# Patient Record
Sex: Male | Born: 1945 | Race: White | Hispanic: No | Marital: Married | Smoking: Former smoker
Health system: Southern US, Community
[De-identification: ages and names within clinical notes are randomized; demographics above are authoritative.]

## PROBLEM LIST (undated history)

## (undated) DIAGNOSIS — M1990 Unspecified osteoarthritis, unspecified site: Secondary | ICD-10-CM

## (undated) HISTORY — PX: HERNIA REPAIR: SHX51

## (undated) HISTORY — PX: TONSILLECTOMY: SUR1361

---

## 2011-08-10 IMAGING — CR DG HAND COMPLETE 3+V*R*
3 series · 3 of 3 positions shown · non-contrast
Comparison: None.

CLINICAL DATA: Hand injury and pain.

RIGHT HAND - COMPLETE 3+ VIEW

[PA]
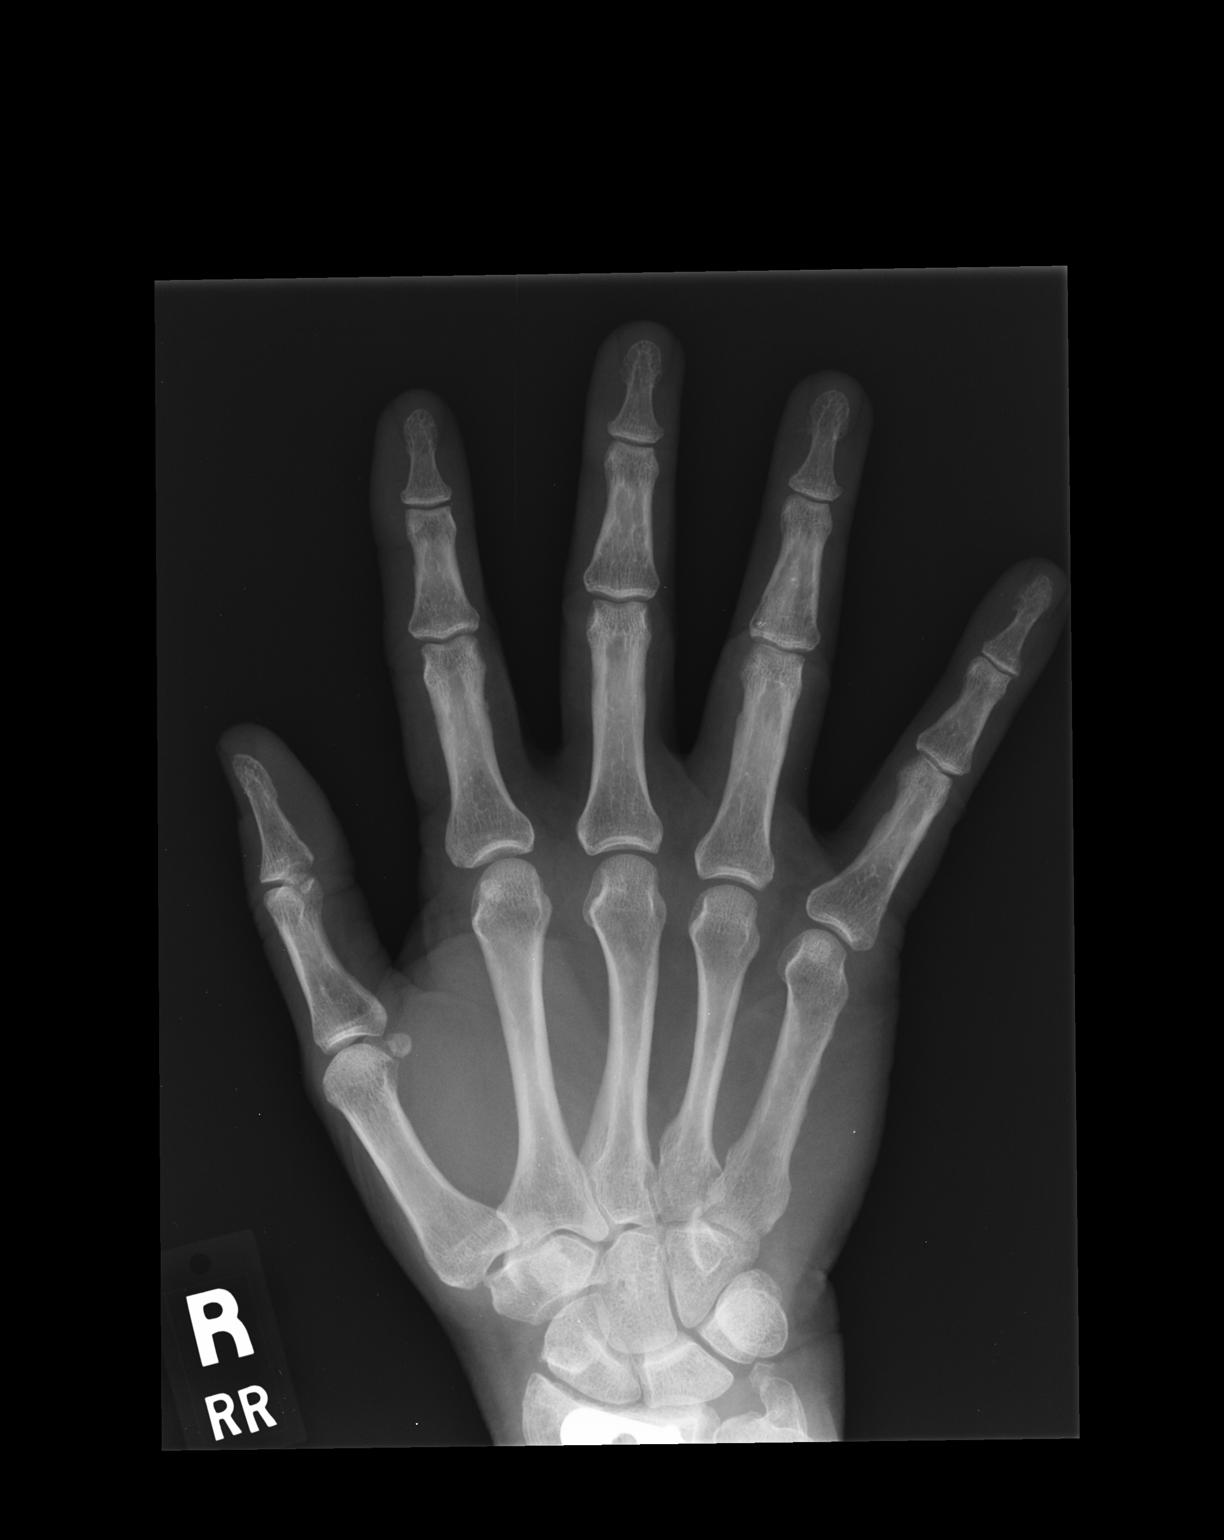

[lateral]
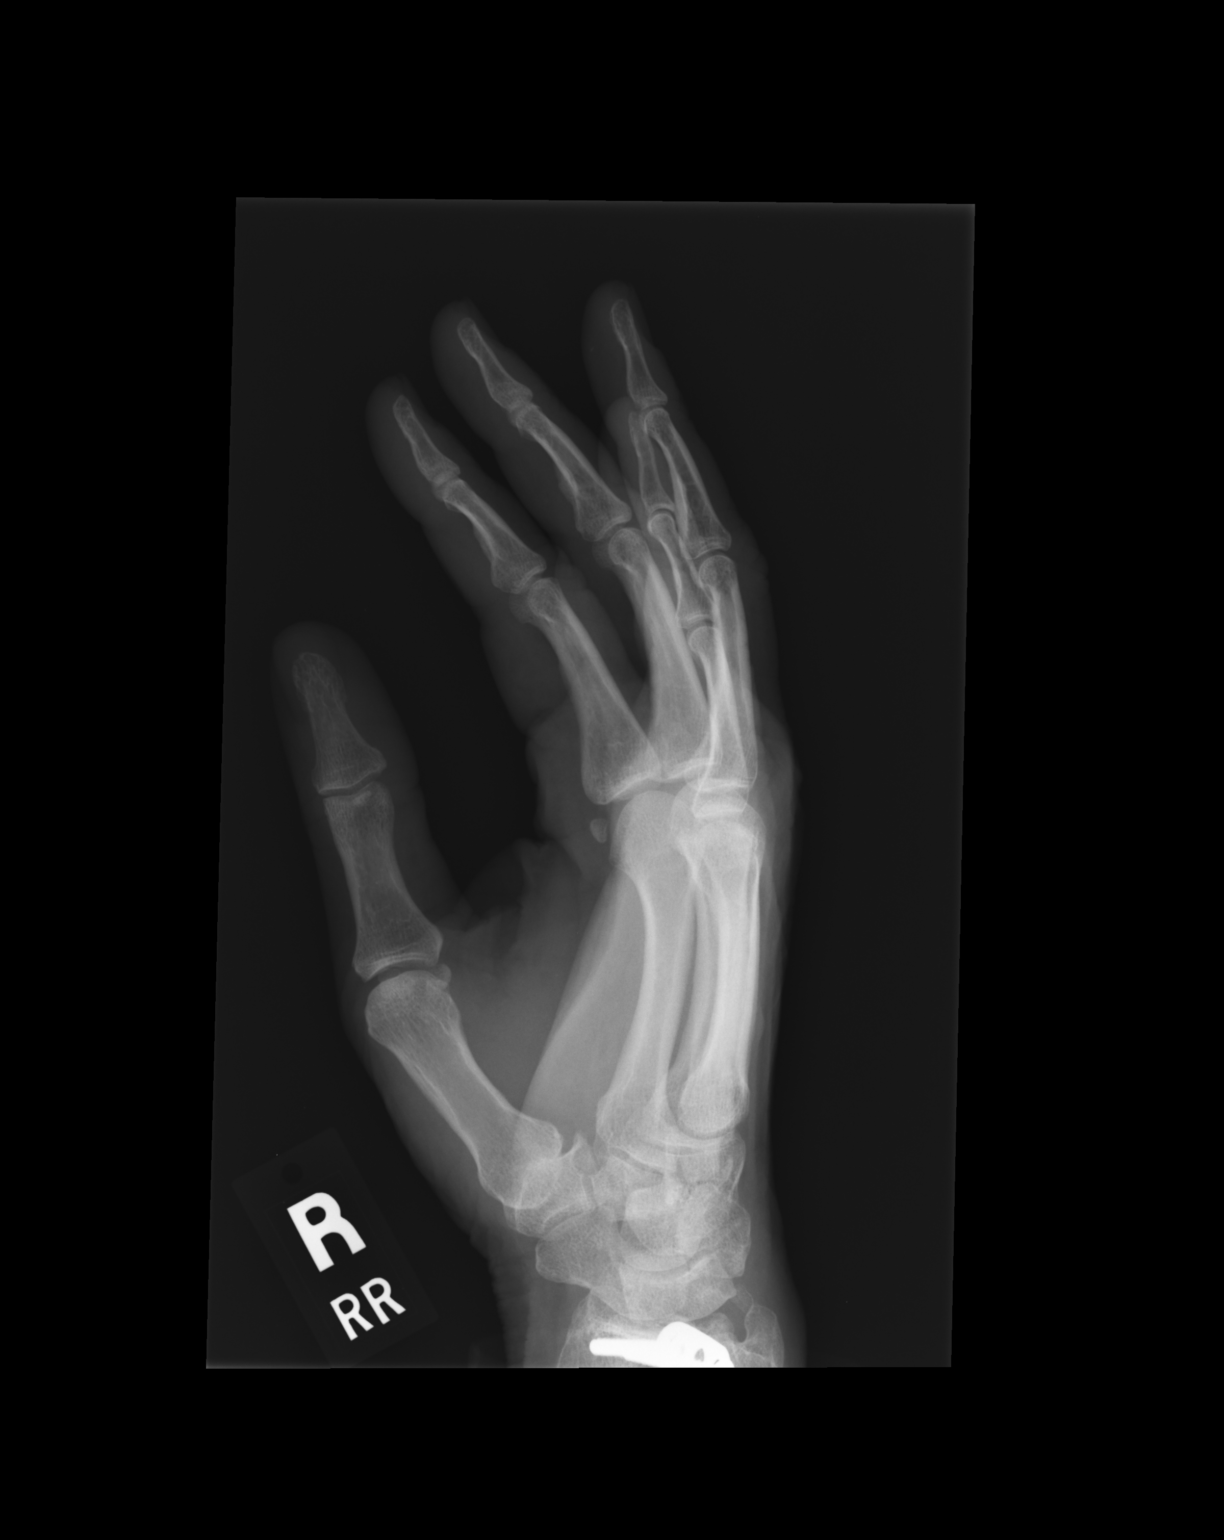

[pa obl]
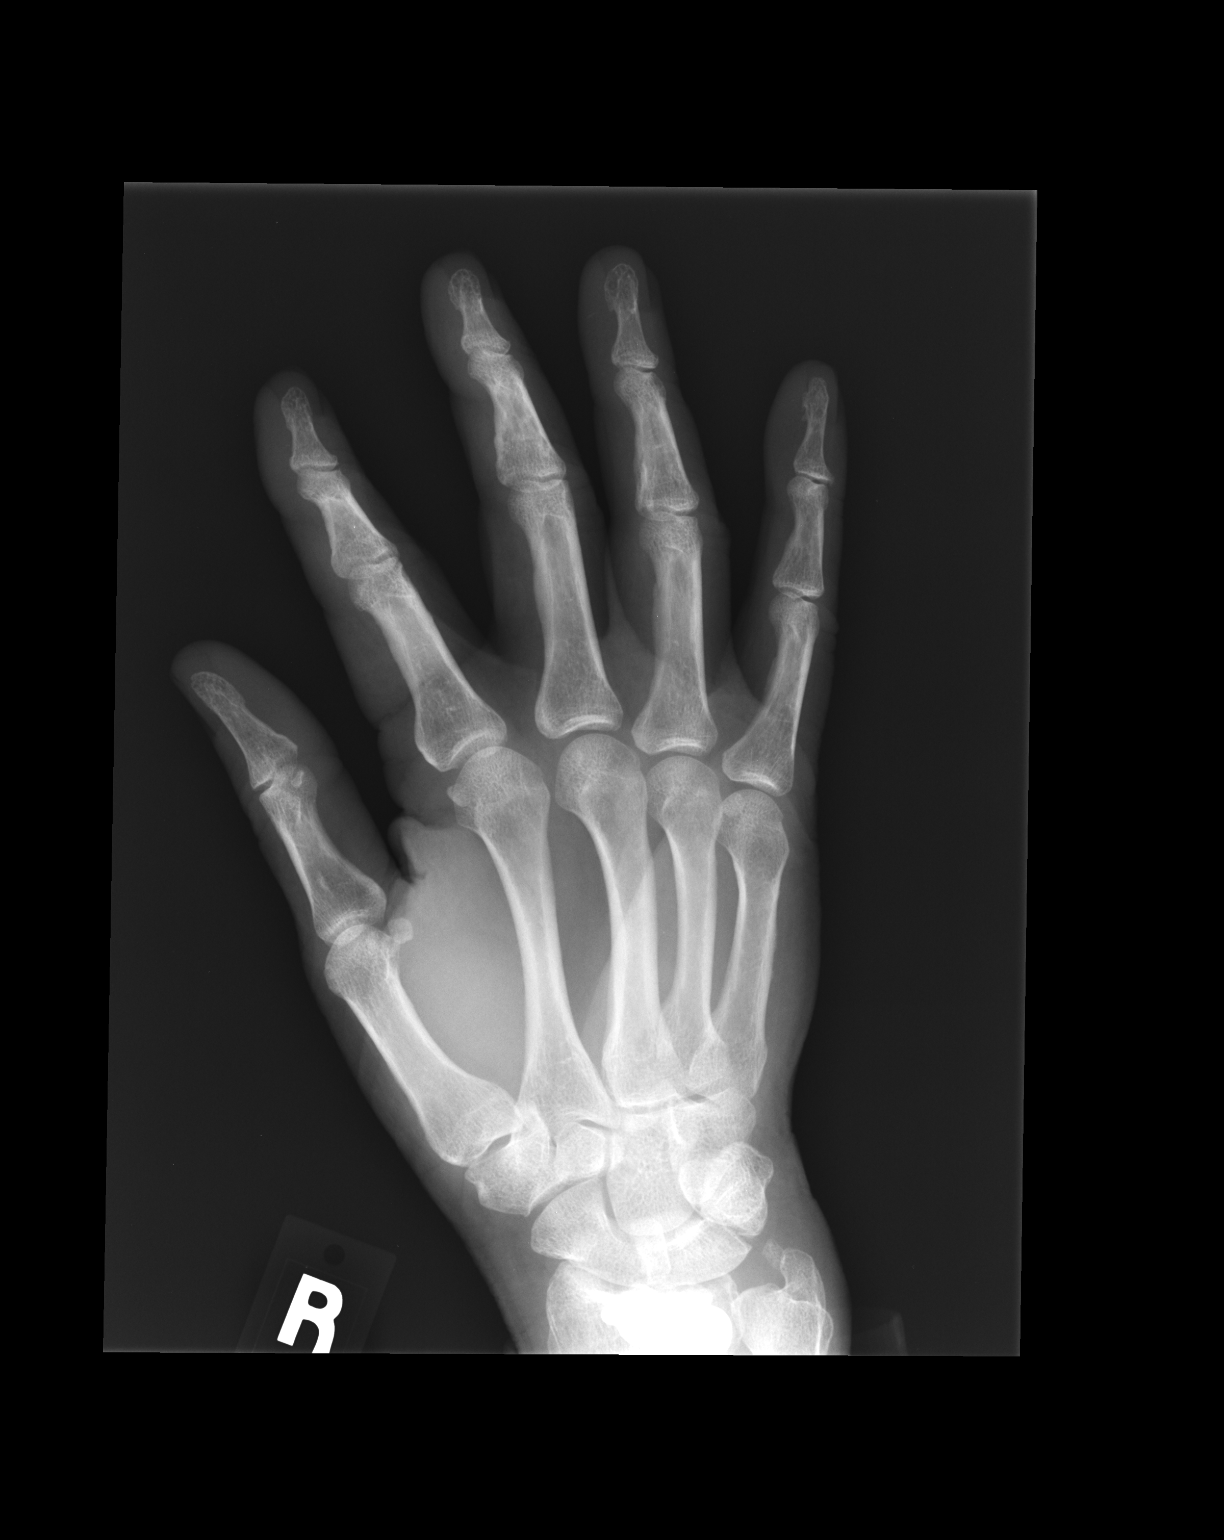

[3 of 3 positions shown; findings below may reference images not displayed]

FINDINGS: No evidence of acute fracture or dislocation.  No other
significant bone abnormality identified.  Distal portion of
fixation plate and screws noted in the distal radius.
IMPRESSION: No acute findings.

## 2013-09-06 ENCOUNTER — Encounter (INDEPENDENT_AMBULATORY_CARE_PROVIDER_SITE_OTHER): Payer: Self-pay

## 2013-09-06 ENCOUNTER — Ambulatory Visit
Admission: RE | Admit: 2013-09-06 | Discharge: 2013-09-06 | Disposition: A | Discharge status: Home / Self Care | Payer: Medicare HMO | Source: Ambulatory Visit | Attending: Nurse Practitioner | Admitting: Nurse Practitioner

## 2013-09-06 ENCOUNTER — Other Ambulatory Visit: Payer: Self-pay | Admitting: Nurse Practitioner

## 2013-09-06 DIAGNOSIS — M79662 Pain in left lower leg: Secondary | ICD-10-CM

## 2013-09-06 DIAGNOSIS — M79669 Pain in unspecified lower leg: Secondary | ICD-10-CM

## 2015-08-18 ENCOUNTER — Telehealth: Payer: Self-pay | Admitting: Genetic Counselor

## 2015-08-18 ENCOUNTER — Encounter: Payer: Self-pay | Admitting: Genetic Counselor

## 2015-08-18 NOTE — Telephone Encounter (Signed)
Wife called in to schedule appt for genetic counseling due to son testing positive for gene SDHB, verified address and insurance, mailed new patient packet

## 2015-09-25 ENCOUNTER — Encounter: Payer: Self-pay | Admitting: Genetic Counselor

## 2015-09-25 ENCOUNTER — Other Ambulatory Visit: Payer: Medicare HMO

## 2015-09-25 ENCOUNTER — Ambulatory Visit (HOSPITAL_BASED_OUTPATIENT_CLINIC_OR_DEPARTMENT_OTHER): Payer: Medicare HMO | Admitting: Genetic Counselor

## 2015-09-25 DIAGNOSIS — Z808 Family history of malignant neoplasm of other organs or systems: Secondary | ICD-10-CM

## 2015-09-25 DIAGNOSIS — Z8489 Family history of other specified conditions: Secondary | ICD-10-CM | POA: Diagnosis not present

## 2015-09-25 DIAGNOSIS — Z809 Family history of malignant neoplasm, unspecified: Secondary | ICD-10-CM | POA: Diagnosis not present

## 2015-09-25 NOTE — Progress Notes (Signed)
REFERRING PROVIDER: No referring provider defined for this encounter.  PRIMARY PROVIDER:  No primary care provider on file.  PRIMARY REASON FOR VISIT:  1. Family history of genetic disorder   2. Family history of pheochromocytoma   3. Family history of familial paraganglioma   4. Family history of skin cancer   5. Family history of cancer      HISTORY OF PRESENT ILLNESS:   Mr. Mish, a 70 y.o. male, was seen for a [facility] cancer genetics consultation due to a family history of a paraganglioma/pheochromocytoma and a known familial pathogenic SDHB gene mutation found in Mr. Viens oldest son.  Mr. Baskerville presents to clinic today with his wife, Bethena Roys, and his youngest son, Jenny Reichmann, to discuss the possibility of a hereditary predisposition to cancer, genetic testing, and to further clarify his future cancer risks, as well as potential cancer risks for family members.    Mr. Pancoast is a 70 y.o. male with no personal history of cancer.  In consideration of any potential clinical features similar to those that his son Rodman Key was experiencing prior to his diagnosis (headache, neck pain, high blood pressure, anxiety, excessive thirst, discomfort similar to what one might experience with a kidney stone, etc), Mr. Dabdoub reports that he has similar high blood pressure and experiences "road rage".   RISK FACTORS:  Colonoscopy: yes; most recent was approximately 3-4 years ago and was normal; 10-year schedule. Up to date with prostate exams:  no. Any excessive radiation exposure in the past:  no   Social History   Social History  . Marital Status: Married    Spouse Name: N/A  . Number of Children: N/A  . Years of Education: N/A   Social History Main Topics  . Smoking status: Former Smoker -- 20 years    Types: Cigarettes    Quit date: 03/29/1985  . Smokeless tobacco: Former Systems developer    Types: Chew     Comment: less than 1 ppd; chewed tobacco for 8 years  . Alcohol Use: Yes     Comment: "not a lot"   . Drug Use: None  . Sexual Activity: Not Asked   Other Topics Concern  . None   Social History Narrative  . None     FAMILY HISTORY:  We obtained a detailed, 4-generation family history.  Significant diagnoses are listed below: Family History  Problem Relation Age of Onset  . Cancer Maternal Uncle     NOS cancer; smoker and worked in Research officer, trade union  . Coronary artery disease Maternal Grandfather     smoker  . Kidney failure Paternal Grandfather   . Stroke Paternal Grandfather     hx stroke - late 65s  . Other Son     paraganglioma w/ "invasion of kidney", s/p kidney removal; +SDHB gene mutation  . Skin cancer Other     (x2) nephews w/ skin cancer - at least one was a melanoma in situ  . Cancer Other     paternal great uncle (PGF's brother) w/ NOS cancer  . Kidney failure Other     paternal great grandfather (PGF's dad)    Mr. Hi has two sons, Rodman Key, age 10, and Jenny Reichmann, age 39.  Rodman Key was having symptoms such as high blood pressure, headaches, neck pain, etc, when he presented to his doctor.  His medical team had some difficulty bringing his blood pressure down, and, eventually decided to check for cancer.  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  Mr. Dorfman son Jenny Reichmann has never been diagnosed with cancer, but he does report a history of high blood pressure prior to weight loss.  Rodman Key has two children, ages 74 and 69.  John also has two children, ages 20 years and 8 months.  Mr. Sisney has two sisters, ages 75 and 67--neither of whom have been diagnosed with cancer.  His oldest sister has two sons who were  diagnosed with skin cancers--at least one with a melanoma in situ.  Mr. Schrick mother died from age-related causes in her late 61s.  She had no history of cancer.  She had one full brother and three full sisters, all of whom passed away in their 79s-80s.  Her brother had a history of an unspecified type of cancer from which he passed away in his early 3s.  This brother was a smoker and also worked in a tobacco factory in [employer], [employer].  Mr. Goad is not aware of any history of cancer in his maternal first cousins.  His maternal grandmother died in her late 59s.  His grandfather, a smoker, died of "hardening of the arteries" at age 72-70.  Mr. Blauer has limited information for his maternal great aunts/uncles and great grandparents, but he is unaware of any additional family history of cancer.  Mr. Bell father died at the age of 38 and had no personal history of cancer.  His father had four full sisters, all of whom passed away at later ages and never had cancer.  Mr. Babar reports not known cancer in his paternal first cousins. His paternal grandmother died at 8.  His grandfather died of kidney failure between 65-68.  This grandfather also had a history of a stroke in his late 47s.  His grandfather had a brother who had a history of an unspecified type of cancer.  His grandfather's father also had a history of renal failure.  Mr. Inzunza is unaware of any additional family history of genetic testing for hereditary cancer/tumors.  Patient's maternal ancestors are of [place] and [place] descent, and paternal ancestors are of [place] and [place] descent. There is no reported Ashkenazi Jewish ancestry. There is no known consanguinity.  GENETIC COUNSELING ASSESSMENT: Zell Drinkard is a 70 y.o. male with a family history of a known pathogenic SDHB gene mutation and family history of paraganglioma/pheochromocytoma.  We, therefore, discussed that Mr. Zaman has up to a 50% chance of testing positive for this known pathogenic  SDHB mutation and recommended the following at today's visit.   DISCUSSION: We reviewed the characteristics, features and inheritance patterns of hereditary paraganglioma/pheochromocytoma, type 4. We also discussed genetic testing, including the appropriate family members to test, the process of testing, insurance coverage and turn-around-time for results. We discussed the implications of a negative, positive and/or variant of uncertain significant result. We recommended Mr. 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   Based on Mr. Lupold family history of cancer/genetic mutation status, he meets medical criteria for genetic testing. Despite that he meets criteria, he may still have an out of pocket cost. We discussed that if his out of pocket cost for testing is over $100, the laboratory will  call and confirm whether he wants to proceed with testing.  If the out of pocket cost of testing is less than $100 he will be billed by the genetic testing laboratory.   PLAN: After considering the risks, benefits, and limitations, Mr. Langenberg  provided informed consent to pursue genetic testing and the blood sample was sent to [facility] for analysis of the targeted SDHB variant test. Results should be available within approximately 2-3 weeks' time, at which point they will be disclosed by telephone to Mr. Bunce, as will any additional recommendations warranted by these results. Mr. Lieske will receive a summary of his genetic counseling visit and a copy of his results once available. This information will also be available in Epic. We encouraged Mr. Bastone to remain in contact with cancer genetics annually so that we can continuously update the family history and inform him of any changes in cancer genetics and testing that may be of benefit for his family. Mr. Rathman questions were answered to his satisfaction today. Our contact information was provided  should additional questions or concerns arise.  Thank you for the referral and allowing [place] to share in the care of your patient.   Jeanine Luz, MS, [facility] Certified Genetic Counselor [email or web address] Phone: [phone]  The patient was seen for a total of 75 minutes in face-to-face genetic counseling.  This patient was discussed with Drs. Magrinat, Lindi Adie and/or Burr Medico who agrees with the above.    _______________________________________________________________________ For Office Staff:  Number of people involved in session: 3 Was an Intern/ student involved with case: no

## 2015-10-24 ENCOUNTER — Telehealth: Payer: Self-pay | Admitting: Genetic Counselor

## 2015-10-24 NOTE — Telephone Encounter (Signed)
Discussed with Thomas Greer that he tested negative for the known pathogenic SDHB gene mutation that was first found in his son.  Discussed that this is a true negative test result, so his future screening will not be any different.  He should follow typical cancer screening guidelines, as recommended by his doctor.  He is welcome to call with any questions he may have.

## 2015-10-27 ENCOUNTER — Ambulatory Visit: Payer: Self-pay | Admitting: Genetic Counselor

## 2015-10-27 DIAGNOSIS — Z8489 Family history of other specified conditions: Secondary | ICD-10-CM

## 2015-10-27 DIAGNOSIS — Z1379 Encounter for other screening for genetic and chromosomal anomalies: Secondary | ICD-10-CM

## 2015-10-27 DIAGNOSIS — Z808 Family history of malignant neoplasm of other organs or systems: Secondary | ICD-10-CM

## 2015-10-27 DIAGNOSIS — Z809 Family history of malignant neoplasm, unspecified: Secondary | ICD-10-CM

## 2015-11-06 ENCOUNTER — Encounter: Payer: Self-pay | Admitting: Genetic Counselor

## 2016-01-02 DIAGNOSIS — Z1379 Encounter for other screening for genetic and chromosomal anomalies: Secondary | ICD-10-CM | POA: Insufficient documentation

## 2016-01-02 NOTE — Progress Notes (Signed)
GENETIC TEST RESULT  HPI: Thomas Greer was previously seen in the [facility] clinic due to a family history of a known pathogenic SDHB gene mutation and family history of paraganglioma and other cancers and concerns regarding a hereditary predisposition to cancer. Please refer to our prior cancer genetics clinic note from September 25, 2015 for more information regarding Thomas Greer medical, social and family histories, and our assessment and recommendations, at the time. Thomas Greer recent genetic test results were disclosed to him, as were recommendations warranted by these results. These results and recommendations are discussed in more detail below.  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 Testing was performed through [facility] Romona Curls, MD). That result is now back, the report date for which is October 20, 2015.  Genetic testing was normal, and did not reveal the familial mutation. The test report will be scanned into EPIC and will be located under the Results Review tab in the Pathology>Molecular Pathology section.   CANCER SCREENING RECOMMENDATIONS: We call this result a true negative result because the cancer-causing mutation was identified in Thomas Greer family, and he did not inherit it.  Given this negative result, Thomas Greer chances of developing SDHB-related cancers are the same as they are in the general population.  We, therefore, recommended he continue to follow the cancer management and screening guidelines provided by his oncology and primary providers.   RECOMMENDATIONS FOR FAMILY MEMBERS: Since we know this SDHB gene mutation was not inherited from Thomas Greer, there is no additional genetic testing or cancer screening recommended for his family members at  this time.  His family members should continue to follow their doctors' recommendations for future cancer screening.    FOLLOW-UP: Lastly, we discussed with Thomas Greer that cancer genetics is a rapidly advancing field and it is possible that new genetic tests will be appropriate for him and/or his family members in the future. We encouraged him to remain in contact with cancer genetics on an annual basis so we can update his personal and family histories and let him know of advances in cancer genetics that may benefit this family.   Our contact number was provided. Thomas Greer questions were answered to his satisfaction, and she knows he is welcome to call us at anytime with additional questions or concerns.   Jeanine Luz, MS, [facility] Certified Genetic Counselor [email or web address] Phone: [phone]

## 2016-06-02 ENCOUNTER — Other Ambulatory Visit: Payer: Self-pay | Admitting: Family Medicine

## 2016-06-02 ENCOUNTER — Ambulatory Visit
Admission: RE | Admit: 2016-06-02 | Discharge: 2016-06-02 | Disposition: A | Discharge status: Home / Self Care | Payer: Self-pay | Source: Ambulatory Visit | Attending: Family Medicine | Admitting: Family Medicine

## 2016-06-02 DIAGNOSIS — M549 Dorsalgia, unspecified: Secondary | ICD-10-CM

## 2016-07-12 ENCOUNTER — Other Ambulatory Visit: Payer: Self-pay | Admitting: Physician Assistant

## 2016-07-12 DIAGNOSIS — S22000A Wedge compression fracture of unspecified thoracic vertebra, initial encounter for closed fracture: Secondary | ICD-10-CM

## 2016-07-13 ENCOUNTER — Ambulatory Visit
Admission: RE | Admit: 2016-07-13 | Discharge: 2016-07-13 | Disposition: A | Discharge status: Home / Self Care | Payer: Medicare HMO | Source: Ambulatory Visit | Attending: Physician Assistant | Admitting: Physician Assistant

## 2016-07-13 DIAGNOSIS — S22000A Wedge compression fracture of unspecified thoracic vertebra, initial encounter for closed fracture: Secondary | ICD-10-CM

## 2016-07-14 ENCOUNTER — Other Ambulatory Visit (HOSPITAL_COMMUNITY): Payer: Self-pay | Admitting: Interventional Radiology

## 2016-07-14 DIAGNOSIS — IMO0001 Reserved for inherently not codable concepts without codable children: Secondary | ICD-10-CM

## 2016-07-14 DIAGNOSIS — M4850XA Collapsed vertebra, not elsewhere classified, site unspecified, initial encounter for fracture: Principal | ICD-10-CM

## 2016-07-27 ENCOUNTER — Ambulatory Visit (HOSPITAL_COMMUNITY)
Admission: RE | Admit: 2016-07-27 | Discharge: 2016-07-27 | Disposition: A | Discharge status: Home / Self Care | Payer: Medicare HMO | Source: Ambulatory Visit | Attending: Interventional Radiology | Admitting: Interventional Radiology

## 2016-07-27 DIAGNOSIS — IMO0001 Reserved for inherently not codable concepts without codable children: Secondary | ICD-10-CM

## 2016-07-27 DIAGNOSIS — M4850XA Collapsed vertebra, not elsewhere classified, site unspecified, initial encounter for fracture: Principal | ICD-10-CM

## 2016-07-27 HISTORY — PX: IR RADIOLOGIST EVAL & MGMT: IMG5224

## 2016-07-28 ENCOUNTER — Encounter (HOSPITAL_COMMUNITY): Payer: Self-pay | Admitting: Interventional Radiology

## 2016-08-02 ENCOUNTER — Other Ambulatory Visit: Payer: Self-pay | Admitting: Physician Assistant

## 2016-08-02 DIAGNOSIS — M4840XA Fatigue fracture of vertebra, site unspecified, initial encounter for fracture: Secondary | ICD-10-CM

## 2016-08-11 ENCOUNTER — Other Ambulatory Visit: Payer: Self-pay

## 2016-08-17 ENCOUNTER — Other Ambulatory Visit: Payer: Self-pay

## 2016-08-30 ENCOUNTER — Ambulatory Visit
Admission: RE | Admit: 2016-08-30 | Discharge: 2016-08-30 | Disposition: A | Discharge status: Home / Self Care | Payer: Medicare HMO | Source: Ambulatory Visit | Attending: Physician Assistant | Admitting: Physician Assistant

## 2016-08-30 DIAGNOSIS — M4840XA Fatigue fracture of vertebra, site unspecified, initial encounter for fracture: Secondary | ICD-10-CM

## 2016-10-01 ENCOUNTER — Encounter (HOSPITAL_COMMUNITY): Payer: Self-pay

## 2019-05-13 ENCOUNTER — Ambulatory Visit: Payer: Medicare HMO | Attending: Internal Medicine

## 2019-05-13 DIAGNOSIS — Z23 Encounter for immunization: Secondary | ICD-10-CM | POA: Insufficient documentation

## 2019-05-13 NOTE — Progress Notes (Signed)
   Covid-19 Vaccination Clinic  Name:  Taimur Feicht    MRN: [id number] DOB: 07-08-1945  05/13/2019  Mr. Bacak was observed post Covid-19 immunization for 15 minutes without incidence. He was provided with Vaccine Information Sheet and instruction to access the V-Safe system.   Mr. Watwood was instructed to call 911 with any severe reactions post vaccine: Marland Kitchen Difficulty breathing  . Swelling of your face and throat  . A fast heartbeat  . A bad rash all over your body  . Dizziness and weakness    Immunizations Administered    Name Date Dose VIS Date Route   Pfizer COVID-19 Vaccine 05/13/2019  9:54 AM 0.3 mL 03/09/2019 Intramuscular   Manufacturer: [place]   Lot: [id number]   [place]: [id number]

## 2019-06-05 ENCOUNTER — Ambulatory Visit: Payer: Medicare HMO | Attending: Internal Medicine

## 2019-06-05 DIAGNOSIS — Z23 Encounter for immunization: Secondary | ICD-10-CM

## 2019-06-05 NOTE — Progress Notes (Signed)
   Covid-19 Vaccination Clinic  Name:  Cristoval Shines    MRN: [id number] DOB: 1946-01-17  06/05/2019  Mr. Ochsner was observed post Covid-19 immunization for 15 minutes without incident. He was provided with Vaccine Information Sheet and instruction to access the V-Safe system.   Mr. Yudin was instructed to call 911 with any severe reactions post vaccine: Marland Kitchen Difficulty breathing  . Swelling of face and throat  . A fast heartbeat  . A bad rash all over body  . Dizziness and weakness   Immunizations Administered    Name Date Dose VIS Date Route   Pfizer COVID-19 Vaccine 06/05/2019  9:46 AM 0.3 mL 03/09/2019 Intramuscular   Manufacturer: [place]   Lot: [id number]   [place]: [id number]

## 2020-05-21 DIAGNOSIS — Z03818 Encounter for observation for suspected exposure to other biological agents ruled out: Secondary | ICD-10-CM | POA: Diagnosis not present

## 2020-05-21 DIAGNOSIS — Z20822 Contact with and (suspected) exposure to covid-19: Secondary | ICD-10-CM | POA: Diagnosis not present

## 2020-09-02 DIAGNOSIS — H00012 Hordeolum externum right lower eyelid: Secondary | ICD-10-CM | POA: Diagnosis not present

## 2020-12-12 ENCOUNTER — Telehealth: Payer: Self-pay

## 2020-12-12 NOTE — Telephone Encounter (Signed)
Ok to establish as a new patient

## 2020-12-12 NOTE — Telephone Encounter (Signed)
Patient's wife would like to know if you will accept him as a new patient.  Please let me know.  Thanks

## 2020-12-29 DIAGNOSIS — H00021 Hordeolum internum right upper eyelid: Secondary | ICD-10-CM | POA: Diagnosis not present

## 2020-12-31 DIAGNOSIS — L989 Disorder of the skin and subcutaneous tissue, unspecified: Secondary | ICD-10-CM | POA: Diagnosis not present

## 2021-01-14 DIAGNOSIS — C44509 Unspecified malignant neoplasm of skin of other part of trunk: Secondary | ICD-10-CM | POA: Insufficient documentation

## 2021-01-14 DIAGNOSIS — C44519 Basal cell carcinoma of skin of other part of trunk: Secondary | ICD-10-CM | POA: Diagnosis not present

## 2021-03-02 ENCOUNTER — Ambulatory Visit (INDEPENDENT_AMBULATORY_CARE_PROVIDER_SITE_OTHER): Payer: Medicare HMO | Admitting: Family Medicine

## 2021-03-02 ENCOUNTER — Encounter: Payer: Self-pay | Admitting: Family Medicine

## 2021-03-02 VITALS — BP 128/70 | HR 68 | Temp 98.9°F | Resp 16 | Ht 71.5 in | Wt 180.8 lb

## 2021-03-02 DIAGNOSIS — Z125 Encounter for screening for malignant neoplasm of prostate: Secondary | ICD-10-CM | POA: Diagnosis not present

## 2021-03-02 DIAGNOSIS — Z0000 Encounter for general adult medical examination without abnormal findings: Secondary | ICD-10-CM | POA: Insufficient documentation

## 2021-03-02 DIAGNOSIS — Z13220 Encounter for screening for lipoid disorders: Secondary | ICD-10-CM

## 2021-03-02 DIAGNOSIS — R197 Diarrhea, unspecified: Secondary | ICD-10-CM | POA: Insufficient documentation

## 2021-03-02 LAB — CBC WITH DIFFERENTIAL/PLATELET
Basophils Absolute: 0.1 10*3/uL (ref 0.0–0.1)
Basophils Relative: 1 % (ref 0.0–3.0)
Eosinophils Absolute: 0.3 10*3/uL (ref 0.0–0.7)
Eosinophils Relative: 6.4 % — ABNORMAL HIGH (ref 0.0–5.0)
HCT: 41.8 % (ref 39.0–52.0)
Hemoglobin: 13.8 g/dL (ref 13.0–17.0)
Lymphocytes Relative: 26.1 % (ref 12.0–46.0)
Lymphs Abs: 1.4 10*3/uL (ref 0.7–4.0)
MCHC: 32.9 g/dL (ref 30.0–36.0)
MCV: 86.8 fl (ref 78.0–100.0)
Monocytes Absolute: 0.4 10*3/uL (ref 0.1–1.0)
Monocytes Relative: 8.2 % (ref 3.0–12.0)
Neutro Abs: 3.1 10*3/uL (ref 1.4–7.7)
Neutrophils Relative %: 58.3 % (ref 43.0–77.0)
Platelets: 214 10*3/uL (ref 150.0–400.0)
RBC: 4.82 Mil/uL (ref 4.22–5.81)
RDW: 13.8 % (ref 11.5–15.5)
WBC: 5.3 10*3/uL (ref 4.0–10.5)

## 2021-03-02 LAB — BASIC METABOLIC PANEL
BUN: 16 mg/dL (ref 6–23)
CO2: 26 mEq/L (ref 19–32)
Calcium: 9.1 mg/dL (ref 8.4–10.5)
Chloride: 105 mEq/L (ref 96–112)
Creatinine, Ser: 0.76 mg/dL (ref 0.40–1.50)
GFR: 87.77 mL/min (ref >60.00)
Glucose, Bld: 97 mg/dL (ref 70–99)
Potassium: 4.1 mEq/L (ref 3.5–5.1)
Sodium: 139 mEq/L (ref 135–145)

## 2021-03-02 LAB — HEPATIC FUNCTION PANEL
ALT: 16 U/L (ref 0–53)
AST: 23 U/L (ref 0–37)
Albumin: 3.9 g/dL (ref 3.5–5.2)
Alkaline Phosphatase: 70 U/L (ref 39–117)
Bilirubin, Direct: 0.1 mg/dL (ref 0.0–0.3)
Total Bilirubin: 0.5 mg/dL (ref 0.2–1.2)
Total Protein: 6.9 g/dL (ref 6.0–8.3)

## 2021-03-02 LAB — LIPID PANEL
Cholesterol: 206 mg/dL — ABNORMAL HIGH (ref 0–200)
HDL: 61.8 mg/dL (ref >39.00)
LDL Cholesterol: 119 mg/dL — ABNORMAL HIGH (ref 0–99)
NonHDL: 144.24
Total CHOL/HDL Ratio: 3
Triglycerides: 124 mg/dL (ref 0.0–149.0)
VLDL: 24.8 mg/dL (ref 0.0–40.0)

## 2021-03-02 LAB — TSH: TSH: 0.77 u[IU]/mL (ref 0.35–5.50)

## 2021-03-02 LAB — PSA, MEDICARE: PSA: 2.42 ng/ml (ref 0.10–4.00)

## 2021-03-02 NOTE — Patient Instructions (Signed)
Follow up in 1 year or as needed We'll notify you of your lab results and make any changes if needed Keep up the good work on healthy diet and regular exercise- you look great! Ask work for your shot records and send me an updated copy Call with any questions or concerns Stay Safe!  Stay Healthy! Welcome!  We're glad to have you!!!

## 2021-03-02 NOTE — Assessment & Plan Note (Signed)
Pt's PE WNL.  UTD on colonoscopy and reportedly immunizations (but we are trying to track those down).  Check labs to risk stratify.  Anticipatory guidance provided.

## 2021-03-02 NOTE — Progress Notes (Signed)
   Subjective:    Patient ID: Thomas Greer, male    DOB: 10/17/1945, 75 y.o.   MRN: [id number]  HPI New to establish care.  Previous MD- [facility]  CPE- UTD on colon cancer screen.  Colonoscopy w/ Dr Benson Norway 03/20/12.  Unclear on immunizations but pt will attempt to get those from previous practice.  Pt reports he has had 1 Shingrix.  Unclear on Tdap status and PNA.  UTD on flu.  He takes no chronic medication and has no concerns today.  + stress and anxiety since passing of son 01/27/21.   Review of Systems Patient reports no vision/hearing changes, anorexia, fever ,adenopathy, persistant/recurrent hoarseness, swallowing issues, chest pain, palpitations, edema, persistant/recurrent cough, hemoptysis, dyspnea (rest,exertional, paroxysmal nocturnal), gastrointestinal  bleeding (melena, rectal bleeding), abdominal pain, excessive heart burn, GU symptoms (dysuria, hematuria, voiding/incontinence issues) syncope, focal weakness, memory loss, numbness & tingling, skin/hair/nail changes, depression, anxiety, abnormal bruising/bleeding, musculoskeletal symptoms/signs.   This visit occurred during the SARS-CoV-2 public health emergency.  Safety protocols were in place, including screening questions prior to the visit, additional usage of staff PPE, and extensive cleaning of exam room while observing appropriate contact time as indicated for disinfecting solutions.      Objective:   Physical Exam General Appearance:    Alert, cooperative, no distress, appears stated age  Head:    Normocephalic, without obvious abnormality, atraumatic  Eyes:    PERRL, conjunctiva/corneas clear, EOM's intact, fundi    benign, both eyes       Ears:    Normal TM's and external ear canals, both ears  Nose:   Deferred due to COVID  Throat:   Neck:   Supple, symmetrical, trachea midline, no adenopathy;       thyroid:  No enlargement/tenderness/nodules  Back:     Symmetric, no curvature, ROM normal, no CVA  tenderness  Lungs:     Clear to auscultation bilaterally, respirations unlabored  Chest wall:    No tenderness or deformity  Heart:    Regular rate and rhythm, S1 and S2 normal, no murmur, rub   or gallop  Abdomen:     Soft, non-tender, bowel sounds active all four quadrants,    no masses, no organomegaly  Genitalia:    deferred  Rectal:    Extremities:   Extremities normal, atraumatic, no cyanosis or edema  Pulses:   2+ and symmetric all extremities  Skin:   Skin color, texture, turgor normal, no rashes or lesions  Lymph nodes:   Cervical, supraclavicular, and axillary nodes normal  Neurologic:   CNII-XII intact. Normal strength, sensation and reflexes      throughout          Assessment & Plan:

## 2021-03-03 ENCOUNTER — Telehealth: Payer: Self-pay

## 2021-03-03 NOTE — Telephone Encounter (Signed)
Patient is aware of labs °

## 2021-03-03 NOTE — Telephone Encounter (Signed)
-----   Message from Midge Minium, MD sent at 03/03/2021  7:34 AM EST ----- Labs look great!  No changes at this time

## 2022-03-03 ENCOUNTER — Encounter: Payer: Medicare HMO | Admitting: Family Medicine

## 2022-03-04 ENCOUNTER — Encounter: Payer: Self-pay | Admitting: Family Medicine

## 2022-03-04 ENCOUNTER — Ambulatory Visit (INDEPENDENT_AMBULATORY_CARE_PROVIDER_SITE_OTHER): Payer: Medicare HMO | Admitting: Family Medicine

## 2022-03-04 ENCOUNTER — Telehealth: Payer: Self-pay | Admitting: Family Medicine

## 2022-03-04 ENCOUNTER — Encounter: Payer: Medicare HMO | Admitting: Family Medicine

## 2022-03-04 ENCOUNTER — Telehealth: Payer: Self-pay

## 2022-03-04 VITALS — BP 128/70 | HR 61 | Temp 98.9°F | Resp 18 | Ht 71.5 in | Wt 172.2 lb

## 2022-03-04 DIAGNOSIS — Z23 Encounter for immunization: Secondary | ICD-10-CM | POA: Diagnosis not present

## 2022-03-04 DIAGNOSIS — R21 Rash and other nonspecific skin eruption: Secondary | ICD-10-CM

## 2022-03-04 DIAGNOSIS — Z0000 Encounter for general adult medical examination without abnormal findings: Secondary | ICD-10-CM | POA: Diagnosis not present

## 2022-03-04 DIAGNOSIS — Z125 Encounter for screening for malignant neoplasm of prostate: Secondary | ICD-10-CM

## 2022-03-04 DIAGNOSIS — E785 Hyperlipidemia, unspecified: Secondary | ICD-10-CM

## 2022-03-04 LAB — HEPATIC FUNCTION PANEL
ALT: 16 U/L (ref 0–53)
AST: 23 U/L (ref 0–37)
Albumin: 4 g/dL (ref 3.5–5.2)
Alkaline Phosphatase: 83 U/L (ref 39–117)
Bilirubin, Direct: 0.2 mg/dL (ref 0.0–0.3)
Total Bilirubin: 1 mg/dL (ref 0.2–1.2)
Total Protein: 7.3 g/dL (ref 6.0–8.3)

## 2022-03-04 LAB — BASIC METABOLIC PANEL
BUN: 11 mg/dL (ref 6–23)
CO2: 31 mEq/L (ref 19–32)
Calcium: 9.2 mg/dL (ref 8.4–10.5)
Chloride: 101 mEq/L (ref 96–112)
Creatinine, Ser: 0.8 mg/dL (ref 0.40–1.50)
GFR: 85.82 mL/min (ref >60.00)
Glucose, Bld: 87 mg/dL (ref 70–99)
Potassium: 4.5 mEq/L (ref 3.5–5.1)
Sodium: 139 mEq/L (ref 135–145)

## 2022-03-04 LAB — LIPID PANEL
Cholesterol: 202 mg/dL — ABNORMAL HIGH (ref 0–200)
HDL: 66.1 mg/dL (ref >39.00)
LDL Cholesterol: 120 mg/dL — ABNORMAL HIGH (ref 0–99)
NonHDL: 135.77
Total CHOL/HDL Ratio: 3
Triglycerides: 77 mg/dL (ref 0.0–149.0)
VLDL: 15.4 mg/dL (ref 0.0–40.0)

## 2022-03-04 LAB — CBC WITH DIFFERENTIAL/PLATELET
Basophils Absolute: 0.1 10*3/uL (ref 0.0–0.1)
Basophils Relative: 1 % (ref 0.0–3.0)
Eosinophils Absolute: 0.2 10*3/uL (ref 0.0–0.7)
Eosinophils Relative: 2.3 % (ref 0.0–5.0)
HCT: 42.7 % (ref 39.0–52.0)
Hemoglobin: 14.4 g/dL (ref 13.0–17.0)
Lymphocytes Relative: 18.2 % (ref 12.0–46.0)
Lymphs Abs: 1.5 10*3/uL (ref 0.7–4.0)
MCHC: 33.6 g/dL (ref 30.0–36.0)
MCV: 87 fl (ref 78.0–100.0)
Monocytes Absolute: 0.7 10*3/uL (ref 0.1–1.0)
Monocytes Relative: 8.6 % (ref 3.0–12.0)
Neutro Abs: 5.8 10*3/uL (ref 1.4–7.7)
Neutrophils Relative %: 69.9 % (ref 43.0–77.0)
Platelets: 292 10*3/uL (ref 150.0–400.0)
RBC: 4.91 Mil/uL (ref 4.22–5.81)
RDW: 13.6 % (ref 11.5–15.5)
WBC: 8.3 10*3/uL (ref 4.0–10.5)

## 2022-03-04 LAB — TSH: TSH: 0.66 u[IU]/mL (ref 0.35–5.50)

## 2022-03-04 LAB — PSA, MEDICARE: PSA: 2.82 ng/ml (ref 0.10–4.00)

## 2022-03-04 MED ORDER — TRIAMCINOLONE ACETONIDE 0.1 % EX OINT: 1.0000 | TOPICAL_OINTMENT | Freq: Two times a day (BID) | CUTANEOUS | 1 refills | Status: DC

## 2022-03-04 NOTE — Patient Instructions (Signed)
Follow up in 1 year or as needed We'll notify you of your lab results and make any changes if needed Make sure you are eating regularly- you need fuel to keep going! Use the Triamcinolone ointment twice daily on the rash Call with any questions or concerns Stay Safe!  Stay Healthy! Happy Holidays!!!

## 2022-03-04 NOTE — Telephone Encounter (Signed)
Informed pt of lab results  

## 2022-03-04 NOTE — Telephone Encounter (Signed)
-----   Message from Midge Minium, MD sent at 03/04/2022  4:33 PM EST ----- Labs look great!  No changes at this time

## 2022-03-04 NOTE — Progress Notes (Signed)
   Subjective:    Patient ID: Zi Newbury, male    DOB: 04-Dec-1945, 76 y.o.   MRN: [id number]  HPI CPE- no longer having colonoscopy.  UTD on shingles vaccines- developed a rash on his chest after 2nd vaccine.  UTD on flu shot.  Due for Prevnar 20.  Health Maintenance  Topic Date Due   Medicare Annual Wellness (AWV)  Never done   Hepatitis C Screening  Never done   DTaP/Tdap/Td (1 - Tdap) Never done   Pneumonia Vaccine 61+ Years old (1 - PCV) Never done   Zoster Vaccines- Shingrix (2 of 2) 03/18/2021   INFLUENZA VACCINE  10/27/2021   COVID-19 Vaccine (5 - 2023-24 season) 11/27/2021   HPV VACCINES  Aged Out      Review of Systems Patient reports no vision/hearing changes, anorexia, fever ,adenopathy, persistant/recurrent hoarseness, swallowing issues, chest pain, palpitations, edema, persistant/recurrent cough, hemoptysis, dyspnea (rest,exertional, paroxysmal nocturnal), gastrointestinal  bleeding (melena, rectal bleeding), abdominal pain, excessive heart burn, GU symptoms (dysuria, hematuria, voiding/incontinence issues) syncope, focal weakness, memory loss, numbness & tingling, hair/nail changes, depression, anxiety, abnormal bruising/bleeding, musculoskeletal symptoms/signs.   + rash on chest- appeared after 2nd shingles vaccine. + weight loss- pt is struggling w/ appetite and mood since death of son last 2023-02-17.    Objective:   Physical Exam General Appearance:    Alert, cooperative, no distress, appears stated age  Head:    Normocephalic, without obvious abnormality, atraumatic  Eyes:    PERRL, conjunctiva/corneas clear, EOM's intact both eyes       Ears:    Normal TM's and external ear canals, both ears  Nose:   Nares normal, septum midline, mucosa normal, no drainage   or sinus tenderness  Throat:   Lips, mucosa, and tongue normal; teeth and gums normal  Neck:   Supple, symmetrical, trachea midline, no adenopathy;       thyroid:  No enlargement/tenderness/nodules  Back:      Symmetric, no curvature, ROM normal, no CVA tenderness  Lungs:     Clear to auscultation bilaterally, respirations unlabored  Chest wall:    No tenderness or deformity  Heart:    Regular rate and rhythm, S1 and S2 normal, no murmur, rub   or gallop  Abdomen:     Soft, non-tender, bowel sounds active all four quadrants,    no masses, no organomegaly  Genitalia:    deferred  Rectal:    Extremities:   Extremities normal, atraumatic, no cyanosis or edema  Pulses:   2+ and symmetric all extremities  Skin:   Skin color, texture, turgor normal, maculopapular rash on upper chest and back  Lymph nodes:   Cervical, supraclavicular, and axillary nodes normal  Neurologic:   CNII-XII intact. Normal strength, sensation and reflexes      throughout          Assessment & Plan:

## 2022-03-04 NOTE — Telephone Encounter (Signed)
Pt has updated MBI

## 2022-03-07 NOTE — Assessment & Plan Note (Signed)
Pt's PE WNL w/ exception of truncal rash.  UTD on shingles vaccine, flu shot.  Prevnar 20 given today.  Start Triamcinolone cream for rash.  Check labs.  Anticipatory guidance provided.

## 2022-11-01 ENCOUNTER — Encounter: Payer: Self-pay | Admitting: Family Medicine

## 2022-11-01 ENCOUNTER — Ambulatory Visit (INDEPENDENT_AMBULATORY_CARE_PROVIDER_SITE_OTHER): Payer: Medicare HMO | Admitting: Family Medicine

## 2022-11-01 VITALS — BP 132/80 | HR 67 | Temp 98.8°F | Resp 18 | Ht 71.5 in | Wt 166.4 lb

## 2022-11-01 DIAGNOSIS — M545 Low back pain, unspecified: Secondary | ICD-10-CM

## 2022-11-01 MED ORDER — PREDNISONE 10 MG PO TABS: ORAL_TABLET | ORAL | 0 refills | Status: DC

## 2022-11-01 NOTE — Patient Instructions (Signed)
Follow up as needed- particularly if not improving START the Prednisone as directed- 3 pills at the same time x3 days, then 2 pills at the same time x3 days, then 1 pill daily.  Take w/ food  Hold off on additional ibuprofen, aleve, motrin, etc You can continue to take tylenol HEAT for pain relief Call with any questions or concerns Hang in there!!!

## 2022-11-01 NOTE — Progress Notes (Signed)
   Subjective:    Patient ID: Thomas Greer, male    DOB: [birth year], 77 y.o.   MRN: [id number]  HPI Back pain- Pt lifted a very heavy object last Monday and felt 'a pull'.  Pt reports pain has been worsening.  Some relief w/ Tylenol and topical hemp oil.  Has not tried ibuprofen.  Pain is R sided, does not radiate down leg.  Pain will come and go.  Painful to change positions- particularly from sitting to standing.  No bowel or bladder incontinence.  Pt finds himself leaning forward for comfort.   Review of Systems For ROS see HPI     Objective:   Physical Exam Vitals reviewed.  Constitutional:      General: He is not in acute distress.    Appearance: Normal appearance. He is not ill-appearing.  HENT:     Head: Normocephalic and atraumatic.  Cardiovascular:     Pulses: Normal pulses.  Musculoskeletal:        General: No swelling, tenderness (no TTP over R lower back) or deformity.  Skin:    General: Skin is warm and dry.  Neurological:     Mental Status: He is alert and oriented to person, place, and time.     Motor: No weakness.     Deep Tendon Reflexes: Reflexes normal.     Comments: + SLR on R, (-) SLR on L  Psychiatric:        Mood and Affect: Mood normal.        Behavior: Behavior normal.        Thought Content: Thought content normal.           Assessment & Plan:  Acute LBP- new.  R sided.  Started after he lifted something heavy last week.  Since then, pain has been worsening rather than improving.  Sxs will improve w/ hot shower, tylenol, topical hemp oil.  Has not had any ibuprofen at home to try.  Sxs are worst when changing positions.  Ok when sitting still.  Start Prednisone taper.  Reviewed supportive care and red flags that should prompt return.  Pt expressed understanding and is in agreement w/ plan.

## 2022-11-05 ENCOUNTER — Encounter: Payer: Self-pay | Admitting: Family Medicine

## 2022-11-05 ENCOUNTER — Ambulatory Visit: Payer: Medicare HMO | Admitting: Family Medicine

## 2022-11-05 VITALS — BP 122/70 | HR 62 | Temp 98.8°F | Ht 71.5 in | Wt 168.4 lb

## 2022-11-05 DIAGNOSIS — M545 Low back pain, unspecified: Secondary | ICD-10-CM | POA: Diagnosis not present

## 2022-11-05 MED ORDER — TIZANIDINE HCL 4 MG PO TABS: 4.0000 mg | ORAL_TABLET | Freq: Three times a day (TID) | ORAL | 0 refills | Status: DC | PRN

## 2022-11-05 NOTE — Progress Notes (Signed)
   Subjective:    Patient ID: Thomas Greer, male    DOB: [birth year], 77 y.o.   MRN: [id number]  HPI Back pain- pt was seen 4 days ago for same sxs.  Is currently on Prednisone taper.  Taking Tylenol every 6 hrs.  Pt reports he cannot get out of bed in the morning.  Pain improves as he moves around.  After Monday's visit he went to [place] and likely overdid it.  Pain is still localized to R lower back.  Pain improves w/ leaning forward and worsens w/ sitting/standing upright.  He woke up this morning and back felt better so he went out to garden.  Now pain is worse.  Pain today is dull.  Has spent 2 days this week sitting in the hospital w/ wife.   Review of Systems For ROS see HPI     Objective:   Physical Exam Vitals reviewed.  Constitutional:      General: He is not in acute distress.    Appearance: Normal appearance. He is not ill-appearing.  HENT:     Head: Normocephalic and atraumatic.  Musculoskeletal:     Comments: Crossing and uncrossing legs w/o difficulty  Neurological:     General: No focal deficit present.     Mental Status: He is alert and oriented to person, place, and time.     Cranial Nerves: No cranial nerve deficit.     Motor: No weakness.     Gait: Gait normal.  Psychiatric:     Comments: anxious           Assessment & Plan:  R sided LBP- ongoing.  Pt reports that 2 hrs after taking Prednisone he felt 'healed' and went to spend the rest of the day hiking at [employer].  The next day pain had returned.  Then he spent 2 days sitting at the hospital w/ his wife- sleeping in uncomfortable chairs.  This morning he was feeling a bit better so was out gardening at 6 am.  Told him that this type of muscle strain is going to take time to heal and he needs to take it easy.  If he continues to push the issue each time he is feeling a bit better, he is going to back slide and it is going to take longer to heal.  Since AM stiffness is severe, will add Tizanidine.  He  is to finish the Prednisone and continue Tylenol and heat as needed.  But again stressed that he needs to take it easy and allow time for healing.  Pt expressed understanding and is in agreement w/ plan.

## 2022-11-05 NOTE — Patient Instructions (Addendum)
Follow up as needed or as scheduled FINISH the Prednisone as directed CONTINUE the Tylenol as needed for pain relief ADD the Tizanidine to help w/ spasm Continue to heat! Make sure you are taking it easy!  It needs time to heal! Call with any questions or concerns Hang in there!!

## 2022-11-18 ENCOUNTER — Telehealth: Payer: Self-pay | Admitting: Family Medicine

## 2022-11-18 NOTE — Telephone Encounter (Signed)
Would you like to refill tiZANidine (ZANAFLEX) 4 MG tablet Last seen in office 11/05/2022 for pain in rt side

## 2022-11-18 NOTE — Telephone Encounter (Signed)
Encourage patient to contact the pharmacy for refills or they can request refills through [facility]  (Please schedule appointment if patient has not been seen in over a year)    WHAT PHARMACY WOULD THEY LIKE THIS SENT TO: [id number] - [place], [place] - [address]   MEDICATION NAME & DOSE: tiZANidine (ZANAFLEX) 4 MG tablet   NOTES/COMMENTS FROM PATIENT:      Front office please notify patient: It takes 48-72 hours to process rx refill requests Ask patient to call pharmacy to ensure rx is ready before heading there.

## 2022-11-19 MED ORDER — TIZANIDINE HCL 4 MG PO TABS: 4.0000 mg | ORAL_TABLET | Freq: Three times a day (TID) | ORAL | 0 refills | Status: DC | PRN

## 2022-11-19 NOTE — Telephone Encounter (Signed)
Prescription sent as requested.

## 2022-11-26 ENCOUNTER — Ambulatory Visit: Payer: Medicare HMO | Admitting: Family Medicine

## 2022-11-26 ENCOUNTER — Encounter: Payer: Self-pay | Admitting: Family Medicine

## 2022-11-26 VITALS — BP 124/68 | HR 71 | Temp 98.3°F | Resp 18 | Ht 71.5 in | Wt 163.2 lb

## 2022-11-26 DIAGNOSIS — M545 Low back pain, unspecified: Secondary | ICD-10-CM

## 2022-11-26 MED ORDER — MELOXICAM 15 MG PO TABS: 15.0000 mg | ORAL_TABLET | Freq: Every day | ORAL | 0 refills | Status: DC

## 2022-11-26 NOTE — Patient Instructions (Signed)
Follow up as needed or as scheduled START the Meloxicam once daily- take w/ food You can take 1-2 tylenol as needed up to 3x/day USE the Tizanidine for muscle spasm We'll call you to schedule your Ortho appt Call with any questions or concerns Stay Safe!  Stay Healthy! Hang in there!

## 2022-11-26 NOTE — Progress Notes (Signed)
   Subjective:    Patient ID: Thomas Greer, male    DOB: [birth year], 77 y.o.   MRN: [id number]  HPI Back pain- better than it was 4 weeks ago but it continues.  Pt reports sxs are better in the morning but worsen as the day goes on.  Pt reports pain improves if he leans forward. Worsens w/ prolonged standing.  'it's not a hard pain, it's just a constant pain'.  Tizanidine provides relief but does cause drowsiness.  Is asking about Flexeril.  Taking Tylenol 650mg - takes 2 at a time.   Review of Systems For ROS see HPI     Objective:   Physical Exam Vitals reviewed.  Constitutional:      General: He is not in acute distress.    Appearance: Normal appearance. He is not ill-appearing.  HENT:     Head: Normocephalic and atraumatic.  Cardiovascular:     Pulses: Normal pulses.  Musculoskeletal:        General: Tenderness (mild TTP over R lumbar area) present.  Skin:    General: Skin is warm and dry.     Findings: No rash.  Neurological:     General: No focal deficit present.     Mental Status: He is alert and oriented to person, place, and time.     Gait: Gait normal.     Deep Tendon Reflexes: Reflexes normal.     Comments: (-) SLR bilaterally           Assessment & Plan:  R sided low back pain- ongoing issue.  Better than it was 4 weeks ago but still present.  Improves w/ bending forward which may indicate spinal stenosis.  Pain improves w/ Tylenol and Tizanidine.  Told him Flexeril is more sedating and not recommended for pts >65.  Will refer to ortho and start daily Meloxicam.  Reviewed supportive care and red flags that should prompt return.  Pt expressed understanding and is in agreement w/ plan.

## 2022-12-01 ENCOUNTER — Other Ambulatory Visit: Payer: Self-pay | Admitting: Family Medicine

## 2022-12-01 MED ORDER — TIZANIDINE HCL 4 MG PO TABS: 4.0000 mg | ORAL_TABLET | Freq: Three times a day (TID) | ORAL | 0 refills | Status: DC | PRN

## 2022-12-01 NOTE — Telephone Encounter (Signed)
 Encourage patient to contact the pharmacy for refills or they can request refills through [facility]  (Please schedule appointment if patient has not been seen in over a year)    WHAT PHARMACY WOULD THEY LIKE THIS SENT TO: [id number] - [place], [place] - [address]   MEDICATION NAME & DOSE: tiZANidine (ZANAFLEX) 4 MG tablet   NOTES/COMMENTS FROM PATIENT:      Front office please notify patient: It takes 48-72 hours to process rx refill requests Ask patient to call pharmacy to ensure rx is ready before heading there.

## 2022-12-01 NOTE — Telephone Encounter (Signed)
Pt informed

## 2022-12-01 NOTE — Telephone Encounter (Signed)
Patient is requesting a refill of the following medications: Requested Prescriptions   Pending Prescriptions Disp Refills   tiZANidine (ZANAFLEX) 4 MG tablet 30 tablet 0    Sig: Take 1 tablet (4 mg total) by mouth every 8 (eight) hours as needed for muscle spasms.    Date of patient request: 12/01/2022 Last office visit: 11/26/2022 Date of last refill: 11/19/22 Last refill amount: 30 Follow up time period per chart: PRN

## 2022-12-10 DIAGNOSIS — M545 Low back pain, unspecified: Secondary | ICD-10-CM | POA: Insufficient documentation

## 2022-12-10 DIAGNOSIS — S32010A Wedge compression fracture of first lumbar vertebra, initial encounter for closed fracture: Secondary | ICD-10-CM | POA: Insufficient documentation

## 2022-12-16 DIAGNOSIS — M545 Low back pain, unspecified: Secondary | ICD-10-CM | POA: Diagnosis not present

## 2022-12-21 DIAGNOSIS — M545 Low back pain, unspecified: Secondary | ICD-10-CM | POA: Diagnosis not present

## 2022-12-23 ENCOUNTER — Other Ambulatory Visit: Payer: Self-pay | Admitting: Family Medicine

## 2022-12-23 NOTE — Telephone Encounter (Signed)
Patient is requesting a refill of the following medications: Requested Prescriptions   Pending Prescriptions Disp Refills   meloxicam (MOBIC) 15 MG tablet [Pharmacy Med Name: MELOXICAM 15 MG TABLET] 30 tablet 0    Sig: Take 1 tablet (15 mg total) by mouth daily.    Date of patient request: 12/23/22 Last office visit: 11/26/22 Date of last refill: 11/26/22 Last refill amount: 30 Follow up time period per chart: PRN

## 2022-12-28 DIAGNOSIS — M47816 Spondylosis without myelopathy or radiculopathy, lumbar region: Secondary | ICD-10-CM | POA: Diagnosis not present

## 2022-12-29 ENCOUNTER — Telehealth: Payer: Self-pay | Admitting: Family Medicine

## 2022-12-29 NOTE — Telephone Encounter (Signed)
Encourage patient to contact the pharmacy for refills or they can request refills through [facility]  (Please schedule appointment if patient has not been seen in over a year)    WHAT PHARMACY WOULD THEY LIKE THIS SENT TO: [id number] - [place], [place] - [address]   MEDICATION NAME & DOSE: tiZANidine (ZANAFLEX) 4 MG tablet    NOTES/COMMENTS FROM PATIENT: Pt states the other doctor he is suppose to see in [place] is not going to be able to get him in for a few weeks so he recommended for pt to get a refill     Front office please notify patient: It takes 48-72 hours to process rx refill requests Ask patient to call pharmacy to ensure rx is ready before heading there.

## 2022-12-30 ENCOUNTER — Other Ambulatory Visit: Payer: Self-pay

## 2022-12-30 MED ORDER — TIZANIDINE HCL 4 MG PO TABS: 4.0000 mg | ORAL_TABLET | Freq: Three times a day (TID) | ORAL | 0 refills | Status: DC | PRN

## 2022-12-30 NOTE — Telephone Encounter (Signed)
Last office visit 11/26/2022 Last refill 12/01/2022

## 2022-12-30 NOTE — Telephone Encounter (Signed)
Ok to refill the tizanidine

## 2022-12-30 NOTE — Telephone Encounter (Signed)
Refill sent.

## 2023-03-11 ENCOUNTER — Ambulatory Visit: Payer: Medicare HMO | Admitting: Family Medicine

## 2023-03-11 ENCOUNTER — Encounter: Payer: Self-pay | Admitting: Family Medicine

## 2023-03-11 VITALS — BP 124/72 | HR 57 | Temp 98.7°F | Ht 71.5 in | Wt 161.0 lb

## 2023-03-11 DIAGNOSIS — Z0000 Encounter for general adult medical examination without abnormal findings: Secondary | ICD-10-CM | POA: Diagnosis not present

## 2023-03-11 DIAGNOSIS — R21 Rash and other nonspecific skin eruption: Secondary | ICD-10-CM | POA: Diagnosis not present

## 2023-03-11 DIAGNOSIS — Z1159 Encounter for screening for other viral diseases: Secondary | ICD-10-CM

## 2023-03-11 DIAGNOSIS — E785 Hyperlipidemia, unspecified: Secondary | ICD-10-CM

## 2023-03-11 DIAGNOSIS — M810 Age-related osteoporosis without current pathological fracture: Secondary | ICD-10-CM | POA: Insufficient documentation

## 2023-03-11 DIAGNOSIS — M8000XA Age-related osteoporosis with current pathological fracture, unspecified site, initial encounter for fracture: Secondary | ICD-10-CM

## 2023-03-11 NOTE — Progress Notes (Unsigned)
   Subjective:    Patient ID: Thomas Greer, male    DOB: Oct 08, 1945, 77 y.o.   MRN: [id number]  HPI CPE- UTD on PNA, no longer doing colon cancer screen.  Plans to get flu somewhere else  Patient Care Team    Relationship Specialty Notifications Start End  Sheliah Hatch, MD PCP - General Family Medicine  03/02/21      Health Maintenance  Topic Date Due   Medicare Annual Wellness (AWV)  Never done   Hepatitis C Screening  Never done   DTaP/Tdap/Td (1 - Tdap) Never done   INFLUENZA VACCINE  10/28/2022   COVID-19 Vaccine (5 - 2024-25 season) 11/28/2022   Pneumonia Vaccine 74+ Years old  Completed   Zoster Vaccines- Shingrix  Completed   HPV VACCINES  Aged Out    Review of Systems Patient reports no vision/hearing changes, anorexia, fever ,adenopathy, persistant/recurrent hoarseness, swallowing issues, chest pain, palpitations, edema, persistant/recurrent cough, hemoptysis, dyspnea (rest,exertional, paroxysmal nocturnal), gastrointestinal  bleeding (melena, rectal bleeding), abdominal pain, excessive heart burn, GU symptoms (dysuria, hematuria, voiding/incontinence issues) syncope, focal weakness, memory loss, numbness & tingling, skin/hair/nail changes, depression, anxiety, abnormal bruising/bleeding, musculoskeletal symptoms/signs.     Objective:   Physical Exam General Appearance:    Alert, cooperative, no distress, appears stated age  Head:    Normocephalic, without obvious abnormality, atraumatic  Eyes:    PERRL, conjunctiva/corneas clear, EOM's intact both eyes       Ears:    Normal TM's and external ear canals, both ears  Nose:   Nares normal, septum midline, mucosa normal, no drainage   or sinus tenderness  Throat:   Lips, mucosa, and tongue normal; teeth and gums normal  Neck:   Supple, symmetrical, trachea midline, no adenopathy;       thyroid:  No enlargement/tenderness/nodules  Back:     + kyphosis  Lungs:     Clear to auscultation bilaterally, respirations unlabored   Chest wall:    No tenderness or deformity  Heart:    Regular rate and rhythm, S1 and S2 normal, no murmur, rub   or gallop  Abdomen:     Soft, non-tender, bowel sounds active all four quadrants,    no masses, no organomegaly  Genitalia:    deferred  Rectal:    Extremities:   Extremities normal, atraumatic, no cyanosis or edema  Pulses:   2+ and symmetric all extremities  Skin:   Skin color, texture, turgor normal, no rashes or lesions  Lymph nodes:   Cervical, supraclavicular, and axillary nodes normal  Neurologic:   CNII-XII intact. Normal strength, sensation and reflexes      throughout          Assessment & Plan:

## 2023-03-11 NOTE — Patient Instructions (Signed)
Follow up in 1 year or as needed We'll notify you of your lab results and make any changes if needed They will call you to schedule the bone density test Call with any questions or concerns Stay Safe!  Stay Healthy! Happy Holidays!!

## 2023-03-12 LAB — BASIC METABOLIC PANEL
BUN: 13 mg/dL (ref 7–25)
CO2: 26 mmol/L (ref 20–32)
Calcium: 9.2 mg/dL (ref 8.6–10.3)
Chloride: 102 mmol/L (ref 98–110)
Creat: 0.79 mg/dL (ref 0.70–1.28)
Glucose, Bld: 88 mg/dL (ref 65–99)
Potassium: 4.5 mmol/L (ref 3.5–5.3)
Sodium: 137 mmol/L (ref 135–146)

## 2023-03-12 LAB — CBC WITH DIFFERENTIAL/PLATELET
Absolute Lymphocytes: 1821 {cells}/uL (ref 850–3900)
Absolute Monocytes: 592 {cells}/uL (ref 200–950)
Basophils Absolute: 82 {cells}/uL (ref 0–200)
Basophils Relative: 1.3 %
Eosinophils Absolute: 302 {cells}/uL (ref 15–500)
Eosinophils Relative: 4.8 %
HCT: 41.6 % (ref 38.5–50.0)
Hemoglobin: 13.9 g/dL (ref 13.2–17.1)
MCH: 29.3 pg (ref 27.0–33.0)
MCHC: 33.4 g/dL (ref 32.0–36.0)
MCV: 87.8 fL (ref 80.0–100.0)
MPV: 10.3 fL (ref 7.5–12.5)
Monocytes Relative: 9.4 %
Neutro Abs: 3503 {cells}/uL (ref 1500–7800)
Neutrophils Relative %: 55.6 %
Platelets: 303 10*3/uL (ref 140–400)
RBC: 4.74 10*6/uL (ref 4.20–5.80)
RDW: 12.1 % (ref 11.0–15.0)
Total Lymphocyte: 28.9 %
WBC: 6.3 10*3/uL (ref 3.8–10.8)

## 2023-03-12 LAB — HEPATIC FUNCTION PANEL
AG Ratio: 1.2 (calc) (ref 1.0–2.5)
ALT: 14 U/L (ref 9–46)
AST: 21 U/L (ref 10–35)
Albumin: 3.8 g/dL (ref 3.6–5.1)
Alkaline phosphatase (APISO): 84 U/L (ref 35–144)
Bilirubin, Direct: 0.1 mg/dL (ref 0.0–0.2)
Globulin: 3.2 g/dL (ref 1.9–3.7)
Indirect Bilirubin: 0.4 mg/dL (ref 0.2–1.2)
Total Bilirubin: 0.5 mg/dL (ref 0.2–1.2)
Total Protein: 7 g/dL (ref 6.1–8.1)

## 2023-03-12 LAB — LIPID PANEL
Cholesterol: 181 mg/dL (ref <200)
HDL: 57 mg/dL (ref >=40)
LDL Cholesterol (Calc): 103 mg/dL — ABNORMAL HIGH
Non-HDL Cholesterol (Calc): 124 mg/dL (ref <130)
Total CHOL/HDL Ratio: 3.2 (calc) (ref <5.0)
Triglycerides: 111 mg/dL (ref <150)

## 2023-03-12 LAB — HEPATITIS C ANTIBODY: Hepatitis C Ab: NONREACTIVE

## 2023-03-12 LAB — TSH: TSH: 0.54 m[IU]/L (ref 0.40–4.50)

## 2023-03-12 NOTE — Assessment & Plan Note (Signed)
New.  Pt was found to have compression fx during evaluation of back pain at Ortho.  Will get formal DEXA to determine severity and best way to proceed w/ tx.  Pt expressed understanding and is in agreement w/ plan.

## 2023-03-12 NOTE — Assessment & Plan Note (Signed)
Pt's PE unchanged from previous and WNL w/ exception of kyphosis.  UTD on PNA.  Will get flu shot upcoming.  Check labs.  Anticipatory guidance provided.

## 2023-03-14 ENCOUNTER — Telehealth: Payer: Self-pay

## 2023-03-14 NOTE — Telephone Encounter (Signed)
-----   Message from Neena Rhymes sent at 03/14/2023  7:41 AM EST ----- Labs look great!  No changes at this time

## 2023-03-14 NOTE — Telephone Encounter (Signed)
Pt has been notified.

## 2023-07-22 ENCOUNTER — Ambulatory Visit (INDEPENDENT_AMBULATORY_CARE_PROVIDER_SITE_OTHER): Admitting: Student in an Organized Health Care Education/Training Program

## 2023-07-22 ENCOUNTER — Ambulatory Visit (HOSPITAL_BASED_OUTPATIENT_CLINIC_OR_DEPARTMENT_OTHER)
Admission: RE | Admit: 2023-07-22 | Discharge: 2023-07-22 | Disposition: A | Discharge status: Home / Self Care | Source: Ambulatory Visit | Attending: Student in an Organized Health Care Education/Training Program | Admitting: Student in an Organized Health Care Education/Training Program

## 2023-07-22 ENCOUNTER — Other Ambulatory Visit (HOSPITAL_BASED_OUTPATIENT_CLINIC_OR_DEPARTMENT_OTHER)

## 2023-07-22 ENCOUNTER — Encounter: Payer: Self-pay | Admitting: Student in an Organized Health Care Education/Training Program

## 2023-07-22 VITALS — BP 120/68 | HR 63 | Wt 161.0 lb

## 2023-07-22 DIAGNOSIS — M545 Low back pain, unspecified: Secondary | ICD-10-CM | POA: Diagnosis not present

## 2023-07-22 DIAGNOSIS — M1611 Unilateral primary osteoarthritis, right hip: Secondary | ICD-10-CM | POA: Diagnosis not present

## 2023-07-22 DIAGNOSIS — M8000XA Age-related osteoporosis with current pathological fracture, unspecified site, initial encounter for fracture: Secondary | ICD-10-CM | POA: Diagnosis not present

## 2023-07-22 MED ORDER — ALENDRONATE SODIUM 70 MG PO TABS: 70.0000 mg | ORAL_TABLET | ORAL | 3 refills | Status: AC

## 2023-07-22 NOTE — Assessment & Plan Note (Signed)
 History of T5 compression fracture after a fall in 2018.  DEXA scan in 2018 subsequently showed a hip fracture risk of 5%.  We talked about treatment of osteoporosis.  He is still hard-working person, so will have more risk of falls in the future.  I am going to start treatment with alendronate.  We talked about the risks of this medication, I gave him precautions about reflux and GI upset.  Renal function was normal in December.

## 2023-07-22 NOTE — Assessment & Plan Note (Signed)
 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  This is an acute issue, fall happened about 2 weeks ago, discomfort has been worsening over the last 3 days.  No red flag symptoms besides the fall.  No fevers or chills, no personal history of cancer, no paresthesias or bowel or bladder changes.  He has had a history of compression fracture in his thoracic spine in 2018.  So I think he has pretty significant osteoporosis which has not been treated yet.  I am going to get x-rays of his low back.  He also has some discomfort around the right hip and we will alert range of motion of the right hips, so get an x-ray of the right hip to see if there is osteoarthritis contributing to this discomfort.  We talked about supportive care including ibuprofen.  He has an old prescription of tizanidine , we talked about precautions with that medicine given his advanced age and risk for falls.

## 2023-07-22 NOTE — Patient Instructions (Signed)
 For your low back pain, I recommend using ibuprofen 400 mg 2 or 3 times daily for at most 3 days straight.  You can use tizanidine  1 tablet in the evening if you are having muscle spasm discomfort.  This medication will make you sleepy and may increase the risk of falling.

## 2023-07-22 NOTE — Progress Notes (Addendum)
 Acute Office Visit  Subjective:     Patient ID: Thomas Greer, male    DOB: Dec 28, 1945, 78 y.o.   MRN: [id number]  Chief Complaint  Patient presents with   Back Pain    Patient states he fell about a year and half ago and was seen for that, did seem to get better but here lately its has started to flair up. Patient states the pain moves. Patient states the pain is on the right lower back area that will go down into the leg with a burning, numbness and tingling.     HPI  Patient is in today for acute low back pain.  Patient is a healthy 78 year old person, works on the [employer] farm, comes in today with acute pain in his right lower back.  He had a fall about 2 weeks ago, apparently involving some kind of tree log that fell on top of him as well.  Did okay after that accident, has been working ever since.  However over the last 3 days he has had increasing pain in his right low back radiating down his right buttocks to the mid thigh.  No radiculopathy down to the foot.  No changes in bowel or bladder function.  No paresthesias.  He is able to ambulate, but not able to work.  He does have a history of a thoracic compression fracture after a fall in 2018.     Objective:    BP 120/68   Pulse 63   Wt 161 lb (73 kg)   SpO2 100%   BMI 22.14 kg/m    Physical Exam  Gen: Well-appearing man Neuro: Alert, conversational, full strength in the upper and lower extremities, mild resting tremor in his right hand, slightly hyperreflexic at the right patella, otherwise normal reflexes, normal sensation Back: No focal tenderness over the spinous process, has some tenderness over the sacrum, no skin breakdown.      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  This is an acute issue, fall happened about 2 weeks ago, discomfort has been worsening over the  last 3 days.  No red flag symptoms besides the fall.  No fevers or chills, no personal history of cancer, no paresthesias or bowel or bladder changes.  He has had a history of compression fracture in his thoracic spine in 2018.  So I think he has pretty significant osteoporosis which has not been treated yet.  I am going to get x-rays of his low back.  He also has some discomfort around the right hip and we will alert range of motion of the right hips, so get an x-ray of the right hip to see if there is osteoarthritis contributing to this discomfort.  We talked about supportive care including ibuprofen.  He has an old prescription of tizanidine , we talked about precautions with that medicine given his advanced age and risk for falls.      Relevant Orders   DG Hip Unilat W OR W/O Pelvis 2-3 Views Right   DG Lumbar Spine 2-3 Views   Osteoporosis   History of T5 compression fracture after a fall in 2018.  DEXA scan in 2018 subsequently showed a hip fracture risk of 5%.  We talked about treatment of osteoporosis.  He is still hard-working person, so  will have more risk of falls in the future.  I am going to start treatment with alendronate .  We talked about the risks of this medication, I gave him precautions about reflux and GI upset.  Renal function was normal in December.      Relevant Medications   alendronate  (FOSAMAX ) 70 MG tablet    Meds ordered this encounter  Medications   alendronate  (FOSAMAX ) 70 MG tablet    Sig: Take 1 tablet (70 mg total) by mouth every 7 (seven) days. Take with a full glass of water on an empty stomach.    Dispense:  12 tablet    Refill:  3    Return in about 4 weeks (around 08/19/2023) for osteoporosis treatment.  Ether Hercules, MD

## 2023-07-25 ENCOUNTER — Ambulatory Visit (HOSPITAL_BASED_OUTPATIENT_CLINIC_OR_DEPARTMENT_OTHER)
Admission: RE | Admit: 2023-07-25 | Discharge: 2023-07-25 | Disposition: A | Discharge status: Home / Self Care | Source: Ambulatory Visit | Attending: Student in an Organized Health Care Education/Training Program | Admitting: Student in an Organized Health Care Education/Training Program

## 2023-07-25 DIAGNOSIS — M4856XA Collapsed vertebra, not elsewhere classified, lumbar region, initial encounter for fracture: Secondary | ICD-10-CM | POA: Diagnosis not present

## 2023-07-25 DIAGNOSIS — M545 Low back pain, unspecified: Secondary | ICD-10-CM | POA: Diagnosis not present

## 2023-07-25 DIAGNOSIS — M47816 Spondylosis without myelopathy or radiculopathy, lumbar region: Secondary | ICD-10-CM | POA: Diagnosis not present

## 2023-07-25 NOTE — Addendum Note (Signed)
 Addended by: Juel Nutley T on: 07/25/2023 12:22 PM   Modules accepted: Orders

## 2023-07-28 ENCOUNTER — Ambulatory Visit (INDEPENDENT_AMBULATORY_CARE_PROVIDER_SITE_OTHER): Admitting: Student in an Organized Health Care Education/Training Program

## 2023-07-28 ENCOUNTER — Encounter: Payer: Self-pay | Admitting: Student in an Organized Health Care Education/Training Program

## 2023-07-28 VITALS — BP 154/68 | HR 70 | Wt 168.0 lb

## 2023-07-28 DIAGNOSIS — S32010A Wedge compression fracture of first lumbar vertebra, initial encounter for closed fracture: Secondary | ICD-10-CM

## 2023-07-28 MED ORDER — OXYCODONE HCL 5 MG PO CAPS: 5.0000 mg | ORAL_CAPSULE | Freq: Two times a day (BID) | ORAL | 0 refills | Status: DC | PRN

## 2023-07-28 NOTE — Progress Notes (Signed)
 Acute Office Visit  Subjective:     Patient ID: Thomas Greer, male    DOB: December 06, 1945, 78 y.o.   MRN: [id number]  Chief Complaint  Patient presents with   Back Pain    Back pain has gotten worse.  Has been taking tylenol and ibuprofen.     HPI  Patient is in today for back pain worsening acute low back pain.  I saw the patient in the office about a week ago for this issue.  We had an x-ray of the lumbar spine that shows a 90% L1 compression fracture.  This likely happened after a fall at work in mid April.  Over the last few days he reports some worsening discomfort.  Has had a couple good days, but it is hard for him to even take a shower and walk around his house at this point.  He denies any urinary incontinence, no changes in bowel habits.  No paresthesias, no weakness in his legs.  Having some trouble walking and now using a cane, but seems to be more an issue of stiffness of his lower back rather than weakness.  He reports taking ibuprofen 1 mg twice daily and Tylenol 650 mg 3 times daily.  He is using a back brace which makes him feel little bit more comfortable.  He is taking time off of work.  He started alendronate  without side effects.      Objective:    BP (!) 154/68   Pulse 70   Wt 168 lb (76.2 kg)   SpO2 99%   BMI 23.10 kg/m    Physical Exam  Gen: Uncomfortable appearing Neuro: Alert, conversational, full sensation both legs, full strength in both legs, mildly hyperreflexic at the right patella, normal reflexes on the left, his gait is well-balanced, he is stiff in his low back, but no foot drop.      Assessment & Plan:   Problem List Items Addressed This Visit       Unprioritized   Closed compression fracture of body of L1 vertebra (HCC) - Primary   Acute low back pain over the last 2 weeks that is worsening some, remains functional limiting.  X-ray of the lumbar spine showed a 90% compression fracture at the L1 vertebrae.  We talked about the natural course  of this kind of condition.  I expect that his discomfort is gena last at least 4-6 weeks more.  He has been through this before in 2018 with a thoracic spine compression fracture.  We talked about the limited role of kyphoplasty.  He is doing a good job with using NSAIDs, however despite that the pain still at times becomes intolerable.  We talked about safe use of other pain medications.  We decided to try a low-dose of oxycodone , I recommended he use a half a tablet to start with.  Continue using ibuprofen 400 mg twice daily for up to 7 days.  Continue using Tylenol.  I recommended ambulation, activity as tolerated, stretching, and massage.  No high risk features today, no bowel or bladder changes or paresthesias.  Does have some signs of radiculopathy down the right leg, likely not related to the L1 disease though.      Relevant Medications   oxycodone  (OXY-IR) 5 MG capsule    Meds ordered this encounter  Medications   oxycodone  (OXY-IR) 5 MG capsule    Sig: Take 1 capsule (5 mg total) by mouth 2 (two) times daily as needed for up to  5 days.    Dispense:  10 capsule    Refill:  0    Return in about 1 week (around 08/04/2023) for back pain management.  Ether Hercules, MD

## 2023-07-28 NOTE — Assessment & Plan Note (Signed)
 Acute low back pain over the last 2 weeks that is worsening some, remains functional limiting.  X-ray of the lumbar spine showed a 90% compression fracture at the L1 vertebrae.  We talked about the natural course of this kind of condition.  I expect that his discomfort is gena last at least 4-6 weeks more.  He has been through this before in 2018 with a thoracic spine compression fracture.  We talked about the limited role of kyphoplasty.  He is doing a good job with using NSAIDs, however despite that the pain still at times becomes intolerable.  We talked about safe use of other pain medications.  We decided to try a low-dose of oxycodone , I recommended he use a half a tablet to start with.  Continue using ibuprofen 400 mg twice daily for up to 7 days.  Continue using Tylenol.  I recommended ambulation, activity as tolerated, stretching, and massage.  No high risk features today, no bowel or bladder changes or paresthesias.  Does have some signs of radiculopathy down the right leg, likely not related to the L1 disease though.

## 2023-07-29 ENCOUNTER — Ambulatory Visit: Payer: Self-pay

## 2023-07-29 NOTE — Telephone Encounter (Signed)
 2nd attempt, phone call is answered but no one speaking with RN on other line. Background noise heard.

## 2023-07-29 NOTE — Telephone Encounter (Signed)
 LVM on both numbers listed also.

## 2023-07-29 NOTE — Telephone Encounter (Signed)
  1st attempt, phone was answered but no one directly speaking to RN on phone just noises in background can be heard.  Copied from CRM [id number]. Topic: Clinical - Medication Question >> Jul 29, 2023  9:54 AM Thomas Greer wrote: Reason for CRM: Patient is wondering how to take his medication (oxycodone ). During his visit yesterday, he was instructed to take half a tablet but the instructions on his bottle says 1 tablet, twice daily.  Please call patient on wife's home number: [phone]

## 2023-07-29 NOTE — Telephone Encounter (Signed)
  3rd attempt, called other phone number listed on patient's chart. Left a voicemail for patient or wife to return call for medication questions.  Copied from CRM [id number]. Topic: Clinical - Medication Question >> Jul 29, 2023  9:54 AM [place] J wrote: Reason for CRM: Patient is wondering how to take his medication (oxycodone ). During his visit yesterday, he was instructed to take half a tablet but the instructions on his bottle says 1 tablet, twice daily.   Please call patient on wife's home number: [phone] Reason for Disposition . Third attempt to contact caller AND no contact made. Phone number verified.  Protocols used: No Contact or Duplicate Contact Call-A-AH

## 2023-08-01 ENCOUNTER — Telehealth: Payer: Self-pay

## 2023-08-01 ENCOUNTER — Other Ambulatory Visit: Payer: Self-pay | Admitting: Student in an Organized Health Care Education/Training Program

## 2023-08-01 MED ORDER — TIZANIDINE HCL 4 MG PO TABS: 4.0000 mg | ORAL_TABLET | Freq: Four times a day (QID) | ORAL | 0 refills | Status: DC | PRN

## 2023-08-01 NOTE — Telephone Encounter (Signed)
 Thank you Willow. I think it is a good idea for him to use half a tablet to start with to ensure no side effects, and that it is not too sedating. If he tolerates it well, and needs more pain control, he can use one full tablet.

## 2023-08-01 NOTE — Telephone Encounter (Signed)
 Called patient to relay x-ray results, left VM to return call

## 2023-08-01 NOTE — Telephone Encounter (Signed)
 I don't see where you prescribed this previously please advise if this is something your willing to write for this patient

## 2023-08-01 NOTE — Telephone Encounter (Signed)
 Multiple attempts to contact patient and not able to, just wanted to let you know patient had questions regarding how to take oxycodone . Patient was told take 1/2 tab during his appointment last week but the bottle states 1 tab twice daily. Please advise when you can.

## 2023-08-01 NOTE — Telephone Encounter (Signed)
 Copied from CRM [id number]. Topic: Clinical - Medication Refill >> Aug 01, 2023 11:41 AM Chuck Crater wrote: Most Recent Primary Care Visit:  Provider: Ether Hercules  Department: [facility]  Visit Type: OFFICE VISIT  Date: 07/28/2023  Medication: tiZANidine  (ZANAFLEX ) 4 MG tablet   Has the patient contacted their pharmacy? No (Agent: If no, request that the patient contact the pharmacy for the refill. If patient does not wish to contact the pharmacy document the reason why and proceed with request.) (Agent: If yes, when and what did the pharmacy advise?) bottle says 0 refills  Is this the correct pharmacy for this prescription? Yes If no, delete pharmacy and type the correct one.  This is the patient's preferred pharmacy:  [id number] - [place] - [address] [place] [address] Phone: [phone] Fax: [phone]   Has the prescription been filled recently? No  Is the patient out of the medication? Yes  Has the patient been seen for an appointment in the last year OR does the patient have an upcoming appointment? Yes  Can we respond through MyChart? No  Agent: Please be advised that Rx refills may take up to 3 business days. We ask that you follow-up with your pharmacy.

## 2023-08-01 NOTE — Telephone Encounter (Signed)
-----   Message from Benjiman Bras sent at 07/30/2023  5:22 PM EDT ----- Noted.  Please call patient.  No sign of fracture of the pelvis or hip.  Mild arthritis changes seen.  Continue plan as discussed last visit.  To Dr. Gayl Katos as Arlie Lain.

## 2023-08-02 ENCOUNTER — Encounter: Payer: Self-pay | Admitting: Student in an Organized Health Care Education/Training Program

## 2023-08-02 ENCOUNTER — Ambulatory Visit: Admitting: Student in an Organized Health Care Education/Training Program

## 2023-08-02 VITALS — BP 136/70 | HR 75 | Wt 168.0 lb

## 2023-08-02 DIAGNOSIS — N401 Enlarged prostate with lower urinary tract symptoms: Secondary | ICD-10-CM | POA: Diagnosis not present

## 2023-08-02 DIAGNOSIS — S32010A Wedge compression fracture of first lumbar vertebra, initial encounter for closed fracture: Secondary | ICD-10-CM | POA: Diagnosis not present

## 2023-08-02 DIAGNOSIS — N400 Enlarged prostate without lower urinary tract symptoms: Secondary | ICD-10-CM | POA: Insufficient documentation

## 2023-08-02 DIAGNOSIS — R351 Nocturia: Secondary | ICD-10-CM | POA: Diagnosis not present

## 2023-08-02 MED ORDER — OXYCODONE HCL 5 MG PO CAPS: 5.0000 mg | ORAL_CAPSULE | Freq: Two times a day (BID) | ORAL | 0 refills | Status: DC | PRN

## 2023-08-02 MED ORDER — TAMSULOSIN HCL 0.4 MG PO CAPS: 0.4000 mg | ORAL_CAPSULE | Freq: Every day | ORAL | 3 refills | Status: DC

## 2023-08-02 NOTE — Assessment & Plan Note (Signed)
 Acute low back pain due to L1 compression fracture due to osteoporosis.  He is recovering slowly, but making some progress.  Moving around the house more, starting to have some deconditioning.  Using a cane.  Medication management is helpful, making him more functional, pain is tolerable.  Tolerating oxycodone  5 mg well.  Using about 1 or 2 tablets/day.  I am going to give another 5-day refill of this medicine.  At this point I am going to refer him for outpatient physical therapy as I think he is about ready to start that in the next 1-2 weeks.

## 2023-08-02 NOTE — Telephone Encounter (Signed)
 Left vm to call office

## 2023-08-02 NOTE — Assessment & Plan Note (Signed)
 Significant lower urinary tract symptoms going on for many years but have become very bothersome to him recently.  Because of his acute low back pain it is harder for him to get up and down at night to go urinate.  No high risk symptoms.  He had a normal PSA 2 years ago.  On ultrasound he is not having urinary retention.  His prostate is very large at around 6 cm in diameter.  I am going to start Flomax.  If that is not helpful we can talk about finasteride at next visit.  I gave him precautions about orthostatic hypotension with this medicine.

## 2023-08-02 NOTE — Progress Notes (Signed)
 Established Patient Office Visit  Subjective   Patient ID: Thomas Greer, male    DOB: 1945/07/15  Age: 78 y.o. MRN: [id number]  Chief Complaint  Patient presents with   Back Pain    Follow up on back pain, yesterday was some relief but today does have the same pain again     HPI  78 year old person here for 1 week follow-up of acute low back pain due to a L1 compression fracture.  He reports doing a little better since I last saw him.  He started off with functioning and he reported doing well with it.  Helped him to walk around.  Denies any bad side effects, no sedation, no falls.  Sleeping okay at night but having issues with nocturia.  This has been an issue for many years, but it seems to be worsened now that is so hard for him to get up and out of bed.  No worsening of symptoms after starting the oxycodone .  He does report weak stream, hesitancy, but overall feels like he empties his bladder okay.  Has not returned to work.  Walks around every day using a cane.  Feels like he is getting a little weaker, no issues with balance.  He denies any incontinence, no changes in bowel or bladder control.     Objective:     BP 136/70   Pulse 75   Wt 168 lb (76.2 kg)   SpO2 100%   BMI 23.10 kg/m    Physical Exam  Gen: Well-appearing Abd: Soft, nontender, nonpalpable bladder Ext: Warm, trace pitting edema bilaterally is equal Neuro: Alert, conversational, full strength in the lower extremities, no neuropathies or paresthesias, normal bilateral patellar reflexes.  POCUS: Postvoid residual is minimal in the bladder.  He does have a very large prostate measuring about 6 x 5 cm with some protrusion into the bladder.    Assessment & Plan:   Problem List Items Addressed This Visit       Unprioritized   Closed compression fracture of body of L1 vertebra (HCC)   Acute low back pain due to L1 compression fracture due to osteoporosis.  He is recovering slowly, but making some progress.   Moving around the house more, starting to have some deconditioning.  Using a cane.  Medication management is helpful, making him more functional, pain is tolerable.  Tolerating oxycodone  5 mg well.  Using about 1 or 2 tablets/day.  I am going to give another 5-day refill of this medicine.  At this point I am going to refer him for outpatient physical therapy as I think he is about ready to start that in the next 1-2 weeks.      Relevant Medications   oxycodone  (OXY-IR) 5 MG capsule   Other Relevant Orders   Ambulatory referral to Physical Therapy   BPH (benign prostatic hyperplasia) - Primary   Significant lower urinary tract symptoms going on for many years but have become very bothersome to him recently.  Because of his acute low back pain it is harder for him to get up and down at night to go urinate.  No high risk symptoms.  He had a normal PSA 2 years ago.  On ultrasound he is not having urinary retention.  His prostate is very large at around 6 cm in diameter.  I am going to start Flomax.  If that is not helpful we can talk about finasteride at next visit.  I gave him precautions about orthostatic hypotension  with this medicine.      Relevant Medications   tamsulosin (FLOMAX) 0.4 MG CAPS capsule    Return in 6 days (on 08/08/2023) for back pain management.    Ether Hercules, MD

## 2023-08-03 NOTE — Telephone Encounter (Signed)
 Left vm to call office about results Letter sent out

## 2023-08-03 NOTE — Telephone Encounter (Signed)
 Letter out Vm left to call office

## 2023-08-04 ENCOUNTER — Ambulatory Visit: Payer: Self-pay

## 2023-08-04 ENCOUNTER — Ambulatory Visit: Admitting: Student in an Organized Health Care Education/Training Program

## 2023-08-04 ENCOUNTER — Encounter: Payer: Self-pay | Admitting: Student in an Organized Health Care Education/Training Program

## 2023-08-04 VITALS — BP 160/72 | HR 77 | Wt 166.0 lb

## 2023-08-04 DIAGNOSIS — F4320 Adjustment disorder, unspecified: Secondary | ICD-10-CM | POA: Insufficient documentation

## 2023-08-04 DIAGNOSIS — F4322 Adjustment disorder with anxiety: Secondary | ICD-10-CM | POA: Diagnosis not present

## 2023-08-04 DIAGNOSIS — S32010A Wedge compression fracture of first lumbar vertebra, initial encounter for closed fracture: Secondary | ICD-10-CM

## 2023-08-04 MED ORDER — DULOXETINE HCL 30 MG PO CPEP: 30.0000 mg | ORAL_CAPSULE | Freq: Every day | ORAL | 1 refills | Status: DC

## 2023-08-04 NOTE — Telephone Encounter (Signed)
 Copied from CRM [id number]. Topic: Clinical - Red Word Triage >> Aug 04, 2023  9:04 AM [place] A wrote: [facility] that prompted transfer to Nurse Triage: Patient said that back pain has been ongoing and is not feeling well    Chief Complaint: Low back pain. "I think it's from my medicines. I have anxiety too." Symptoms: Above Frequency: 2 days Pertinent Negatives: Patient denies  Disposition: [] ED /[] Urgent Care (no appt availability in office) / [x] Appointment(In office/virtual)/ []  [facility]/ [] Home Care/ [] Refused Recommended Disposition /[]  Mobile Bus/ []  Follow-up with PCP Additional Notes: Agrees with appointment.  Reason for Disposition  [1] MODERATE back pain (e.g., interferes with normal activities) AND [2] present > 3 days  Answer Assessment - Initial Assessment Questions 1. ONSET: "When did the pain begin?"      08/02/23 2. LOCATION: "Where does it hurt?" (upper, mid or lower back)     Lower 3. SEVERITY: "How bad is the pain?"  (e.g., Scale 1-10; mild, moderate, or severe)   - MILD (1-3): Doesn't interfere with normal activities.    - MODERATE (4-7): Interferes with normal activities or awakens from sleep.    - SEVERE (8-10): Excruciating pain, unable to do any normal activities.      7 4. PATTERN: "Is the pain constant?" (e.g., yes, no; constant, intermittent)      Constant 5. RADIATION: "Does the pain shoot into your legs or somewhere else?"     No 6. CAUSE:  "What do you think is causing the back pain?"      Medicines 7. BACK OVERUSE:  "Any recent lifting of heavy objects, strenuous work or exercise?"     No 8. MEDICINES: "What have you taken so far for the pain?" (e.g., nothing, acetaminophen, NSAIDS)     N/A 9. NEUROLOGIC SYMPTOMS: "Do you have any weakness, numbness, or problems with bowel/bladder control?"     No 10. OTHER SYMPTOMS: "Do you have any other symptoms?" (e.g., fever, abdomen pain, burning with urination, blood in urine)        Feels anxious  11. PREGNANCY: "Is there any chance you are pregnant?" "When was your last menstrual period?"       N/a  Protocols used: Back Pain-A-AH

## 2023-08-04 NOTE — Assessment & Plan Note (Addendum)
 Chronic issue, functional limiting, not making improvement after 4 weeks of supportive care and medical management.  Significantly negative impacts on his mood.  Pain is uncontrolled despite NSAIDs, muscle relaxers, and low-dose opioids.  At this point I am going to start Cymbalta.  Oxycodone  does not seem to be helping him and may be contributing to negative side effects in his mood, so will discontinue.  Because the pain cannot be managed with medical therapy, I am going to get an MRI of the lumbar spine to better evaluate the compression fracture, rule out malignancy.  Will refer to neurosurgery to consider vertebroplasty.

## 2023-08-04 NOTE — Progress Notes (Signed)
 Established Patient Office Visit  Subjective   Patient ID: Thomas Greer, male    DOB: [birth year]  Age: 78 y.o. MRN: [id number]  Chief Complaint  Patient presents with   Back Pain    Since being seen on the 6th of may patient has not been able to sleep for 2 night , went to the bathroom 5 times before 3:30 and after 3:30 had to stack pillows to sit up and was having difficulty breathing and felt mouth was filled with cotton. Is hurting on right side at the hip, 5:30 am uncontrolled shaking. Patient states he did take 2 oxy yesterday which is a first for him. Exteremly anxious.     HPI  78 year old person struggling through a L1 compression fracture over the last 4 weeks returns to the office today sooner than planned because of worsening pain and anxiety symptoms.  He reports feeling hopeless this morning.  Has had some episodes of acute anxiety, trembling, tearfulness.  Pain continues to be severe in his low back, radiating to his right side.  No changes in bowel or bladder function.  No weakness.  It has been 4 weeks total of the symptoms, but we have not seen much benefit and it seems very likely this is going to continue on well past 6 weeks.    Objective:     BP (!) 160/72   Pulse 77   Wt 166 lb (75.3 kg)   SpO2 100%   BMI 22.83 kg/m    Physical Exam  Gen: Tearful appearing man Neuro: Conversational, very slow get up and go, needed assistance to get on the table, full strength in both lower extremities, normal patellar reflexes, normal sensation in both legs, wide-based gait that is antalgic, uses a cane Psych: Moderately anxious and depressed appearing, tearful at times, tremulous at times, organized speech, linear thoughts    Assessment & Plan:   Problem List Items Addressed This Visit       Unprioritized   Closed compression fracture of body of L1 vertebra (HCC) - Primary   Chronic issue, functional limiting, not making improvement after 4 weeks of supportive care and  medical management.  Significantly negative impacts on his mood.  Pain is uncontrolled despite NSAIDs, muscle relaxers, and low-dose opioids.  At this point I am going to start Cymbalta.  Oxycodone  does not seem to be helping him and may be contributing to negative side effects in his mood, so will discontinue.  Because the pain cannot be managed with medical therapy, I am going to get an MRI of the lumbar spine to better evaluate the compression fracture, rule out malignancy.  Will refer to neurosurgery to consider vertebroplasty.      Relevant Medications   DULoxetine (CYMBALTA) 30 MG capsule   Other Relevant Orders   MR Lumbar Spine Wo Contrast   Ambulatory referral to Neurosurgery   Adjustment disorder   Exam today is consistent with adjustment disorder, with both depressed and anxious mood, related to significant changes in his health and functional status.  He is having a lot of trouble adjusting to being out of work.  He is having some passive thoughts of hopelessness, but overall seems low risk for suicidality.  I did recommend that he remove firearms from the house which he agrees to.  We will start Cymbalta.  Follow-up with me in 1 week.      Relevant Medications   DULoxetine (CYMBALTA) 30 MG capsule     [name] Thomas Kailiana Granquist,  MD

## 2023-08-04 NOTE — Assessment & Plan Note (Signed)
 Exam today is consistent with adjustment disorder, with both depressed and anxious mood, related to significant changes in his health and functional status.  He is having a lot of trouble adjusting to being out of work.  He is having some passive thoughts of hopelessness, but overall seems low risk for suicidality.  I did recommend that he remove firearms from the house which he agrees to.  We will start Cymbalta.  Follow-up with me in 1 week.

## 2023-08-05 ENCOUNTER — Ambulatory Visit (HOSPITAL_BASED_OUTPATIENT_CLINIC_OR_DEPARTMENT_OTHER)
Admission: RE | Admit: 2023-08-05 | Discharge: 2023-08-05 | Disposition: A | Discharge status: Home / Self Care | Source: Ambulatory Visit | Attending: Student in an Organized Health Care Education/Training Program

## 2023-08-05 DIAGNOSIS — M51369 Other intervertebral disc degeneration, lumbar region without mention of lumbar back pain or lower extremity pain: Secondary | ICD-10-CM | POA: Diagnosis not present

## 2023-08-05 DIAGNOSIS — S32010A Wedge compression fracture of first lumbar vertebra, initial encounter for closed fracture: Secondary | ICD-10-CM | POA: Diagnosis not present

## 2023-08-05 DIAGNOSIS — S32019A Unspecified fracture of first lumbar vertebra, initial encounter for closed fracture: Secondary | ICD-10-CM | POA: Diagnosis not present

## 2023-08-05 DIAGNOSIS — M47816 Spondylosis without myelopathy or radiculopathy, lumbar region: Secondary | ICD-10-CM | POA: Diagnosis not present

## 2023-08-05 DIAGNOSIS — S3210XA Unspecified fracture of sacrum, initial encounter for closed fracture: Secondary | ICD-10-CM | POA: Diagnosis not present

## 2023-08-08 ENCOUNTER — Encounter: Payer: Self-pay | Admitting: Student in an Organized Health Care Education/Training Program

## 2023-08-08 ENCOUNTER — Ambulatory Visit (INDEPENDENT_AMBULATORY_CARE_PROVIDER_SITE_OTHER): Admitting: Student in an Organized Health Care Education/Training Program

## 2023-08-08 VITALS — BP 120/60 | HR 72 | Wt 160.0 lb

## 2023-08-08 DIAGNOSIS — F4322 Adjustment disorder with anxiety: Secondary | ICD-10-CM

## 2023-08-08 DIAGNOSIS — S32010A Wedge compression fracture of first lumbar vertebra, initial encounter for closed fracture: Secondary | ICD-10-CM | POA: Diagnosis not present

## 2023-08-08 NOTE — Assessment & Plan Note (Signed)
 A little improved symptoms over the weekend.  MRI completed on Friday, radiology result not yet available.  I referred him for neurosurgery consultation, that appointment not yet scheduled.  Continues to have some limited function because of low back pain.  No high risk features.  I am going to order a rolling walker to help him with ambulation.  He feels better with forward flexion likely due to spinal stenosis.  I also recommended he consider other interventions at home like a shower chair, elevated toilet seats, and grab bars to lower his risk of falling.

## 2023-08-08 NOTE — Assessment & Plan Note (Signed)
 Mood is a little improved today.  He has not started duloxetine  yet, although worried about some side effects that were listed on the bottle.  We discussed the risks and benefits.  Will start duloxetine  30 mg daily.

## 2023-08-08 NOTE — Progress Notes (Signed)
   Established Patient Office Visit  Subjective   Patient ID: Thomas Greer, male    DOB: 01-Jan-1946  Age: 78 y.o. MRN: [id number]  Chief Complaint  Patient presents with   Medical Management of Chronic Issues    6 day follow up for back pain management. Patient states today is going better. Patient has quite oxy and is doing ibuprofen and tylenol. Patient states mood is better not taking any prescribed medications.     HPI  78 year old person here for close follow-up of severe low back pain due to a compression fracture causing adjustments and depressed mood issues.  Had a good weekend.  A little more functional over the last few days.  Taking Tylenol and ibuprofen sparingly.  No loss of bowel or bladder control.  Strength is about the same.  Using 2 canes at this point to help him walk because he feels better and forward flexed motion.  He did not start taking duloxetine , read on the bottle it can cause drowsiness and worsening depression.  No fevers or chills.  Sleep is going okay.  Emptying his bladder well, urinary stream is a little improved since starting Flomax .    Objective:     BP 120/60   Pulse 72   Wt 160 lb (72.6 kg)   SpO2 98%   BMI 22.00 kg/m    Physical Exam  Gen: Well-appearing Neuro: Alert, conversational, slow get up and go, wide-based gait, using a cane, slow to get up on the table, full strength in both lower extremities, normal sensation in the lower extremities, normal patellar reflex on the left side, 3+ reflex on the right side. Psych: Appropriate mood and affect, not depressed or anxious.    Assessment & Plan:   Problem List Items Addressed This Visit       High   Closed compression fracture of body of L1 vertebra (HCC) - Primary   A little improved symptoms over the weekend.  MRI completed on Friday, radiology result not yet available.  I referred him for neurosurgery consultation, that appointment not yet scheduled.  Continues to have some limited  function because of low back pain.  No high risk features.  I am going to order a rolling walker to help him with ambulation.  He feels better with forward flexion likely due to spinal stenosis.  I also recommended he consider other interventions at home like a shower chair, elevated toilet seats, and grab bars to lower his risk of falling.      Relevant Orders   For home use only DME 4 wheeled rolling walker with seat ([id number])   Adjustment disorder   Mood is a little improved today.  He has not started duloxetine  yet, although worried about some side effects that were listed on the bottle.  We discussed the risks and benefits.  Will start duloxetine  30 mg daily.       Return in about 1 week (around 08/15/2023) for back pain management.    Ether Hercules, MD

## 2023-08-09 ENCOUNTER — Telehealth: Payer: Self-pay

## 2023-08-09 NOTE — Telephone Encounter (Unsigned)
 Copied from CRM [id number]. Topic: Clinical - Medical Advice >> Aug 09, 2023 10:00 AM Alyse July wrote: Reason for CRM: Patient has an appointment with Dr. Ramiro Burly) and was informed that he needs his report from his MRI to be seen by the provider on Monday at 4:00 pm. Patient would like to pick up report at upcoming appt on the 19th.

## 2023-08-09 NOTE — Telephone Encounter (Signed)
 Returned patients call, Left VM to call back.   Did call radiology reading room to have that MRI Read and should be completed tomorrow or at least by the 19th.

## 2023-08-09 NOTE — Telephone Encounter (Signed)
 Patient called back and stated [facility] needed a copy of his MRI results before they could make him an appt, I did send over these forms over to [facility]  and made a copy for patient to pick up when he comes in for his appt on the 19th of may/

## 2023-08-15 ENCOUNTER — Ambulatory Visit: Admitting: Student in an Organized Health Care Education/Training Program

## 2023-08-15 ENCOUNTER — Other Ambulatory Visit: Payer: Self-pay | Admitting: Neurosurgery

## 2023-08-15 ENCOUNTER — Encounter: Payer: Self-pay | Admitting: Student in an Organized Health Care Education/Training Program

## 2023-08-15 VITALS — BP 136/83 | HR 71 | Temp 98.1°F | Ht 71.5 in | Wt 158.8 lb

## 2023-08-15 DIAGNOSIS — F4322 Adjustment disorder with anxiety: Secondary | ICD-10-CM | POA: Diagnosis not present

## 2023-08-15 DIAGNOSIS — S3210XA Unspecified fracture of sacrum, initial encounter for closed fracture: Secondary | ICD-10-CM | POA: Insufficient documentation

## 2023-08-15 DIAGNOSIS — S3210XD Unspecified fracture of sacrum, subsequent encounter for fracture with routine healing: Secondary | ICD-10-CM

## 2023-08-15 DIAGNOSIS — S32010A Wedge compression fracture of first lumbar vertebra, initial encounter for closed fracture: Secondary | ICD-10-CM

## 2023-08-15 NOTE — Assessment & Plan Note (Signed)
 Symptomatically improving since I last saw him a week ago.  Will continue with duloxetine .  We talked about reasonable expectations.  Would like him to do more outside activity.  Great support from his family.

## 2023-08-15 NOTE — Assessment & Plan Note (Signed)
 Acute issue over the last 6 weeks that continues to be function limiting despite supportive care.  Will continue with medical management with duloxetine  30 mg daily and as needed NSAIDs/Tylenol.  We are treating the comorbid osteoporosis with alendronate .  He has consultation planned with neurosurgery today.  Question is if there is a procedure that may offer him improved functional recovery sooner.  We talked about reasonable expectations.  I did recommend he do physical therapy.

## 2023-08-15 NOTE — Progress Notes (Signed)
   Established Patient Office Visit  Subjective   Patient ID: Thomas Greer, male    DOB: 1945/06/06  Age: 78 y.o. MRN: [id number]  Chief Complaint  Patient presents with   Back Pain    Patient states that he is still having the back pain. 6 first thing in the morning by lunchtime the pain is a 4. Patient states that he has an appointment with the Spinal Doctor today    HPI  78 year old person here for 1 week follow-up of severe acute low back pain.  Doing a little better than I saw him last time.  Had a pretty good weekend.  Able to do some more ambulation, got out of the house most every day.  Interested in returning to driving.  Reports good strength in both of his feet, no paresthesias.  Good range of motion of his upper back and neck.  Mood is improving.  No worsening depressed mood, no safety concerns.  Good adherence with medications.  No side effects.  Some benefit anti-inflammatories.    Objective:     BP 136/83 (BP Location: Right Arm, Patient Position: Sitting, Cuff Size: Normal)   Pulse 71   Temp 98.1 F (36.7 C) (Oral)   Ht 5' 11.5" (1.816 m)   Wt 158 lb 12.8 oz (72 kg)   SpO2 100%   BMI 21.84 kg/m    Physical Exam  Gen: Well-appearing Neuro: Alert, conversational, not sedated, full strength upper and lower extremities, delayed get up and go, antalgic wide-based gait using a cane Psych: Appropriate mood and affect, not anxious or depressed appearing, normal speech, linear thoughts, not tearful today    Assessment & Plan:   Problem List Items Addressed This Visit       High   Closed compression fracture of body of L1 vertebra (HCC) - Primary   Acute issue over the last 6 weeks that continues to be function limiting despite supportive care.  Will continue with medical management with duloxetine  30 mg daily and as needed NSAIDs/Tylenol.  We are treating the comorbid osteoporosis with alendronate .  He has consultation planned with neurosurgery today.  Question is if  there is a procedure that may offer him improved functional recovery sooner.  We talked about reasonable expectations.  I did recommend he do physical therapy.      Adjustment disorder   Symptomatically improving since I last saw him a week ago.  Will continue with duloxetine .  We talked about reasonable expectations.  Would like him to do more outside activity.  Great support from his family.      Sacral fracture (HCC)   Acute issue.  New finding after we did an MRI of the known L1 compression fracture.  This is a result of a fall.  We have been doing supportive care.  We discussed typical nonoperative treatment options for sacral fractures.  Would like to see him do physical therapy soon.  Will continue supportive medications with duloxetine  and NSAIDs.       Return in about 2 weeks (around 08/29/2023) for back pain management.    Ether Hercules, MD

## 2023-08-15 NOTE — Assessment & Plan Note (Signed)
 Acute issue.  New finding after we did an MRI of the known L1 compression fracture.  This is a result of a fall.  We have been doing supportive care.  We discussed typical nonoperative treatment options for sacral fractures.  Would like to see him do physical therapy soon.  Will continue supportive medications with duloxetine  and NSAIDs.

## 2023-08-16 NOTE — Progress Notes (Signed)
 Surgical Instructions   Your procedure is scheduled on Aug 25, 2023. Report to [facility] Main Entrance "A" at 11:00 A.M., then check in with the Admitting office. Any questions or running late day of surgery: call [phone]  Questions prior to your surgery date: call [phone], Monday-Friday, 8am-4pm. If you experience any cold or flu symptoms such as cough, fever, chills, shortness of breath, etc. between now and your scheduled surgery, please notify us  at the above number.     Remember:  Do not eat after midnight the night before your surgery  You may drink clear liquids until 10:00 the morning of your surgery.   Clear liquids allowed are: Water, Non-Citrus Juices (without pulp), Carbonated Beverages, Clear Tea (no milk, honey, etc.), Black Coffee Only (NO MILK, CREAM OR POWDERED CREAMER of any kind), and Gatorade.    Take these medicines the morning of surgery with A SIP OF WATER  DULoxetine  (CYMBALTA )  tamsulosin  (FLOMAX )    May take these medicines IF NEEDED: tiZANidine  (ZANAFLEX )    One week prior to surgery, STOP taking any Aspirin (unless otherwise instructed by your surgeon) Aleve, Naproxen, Ibuprofen, Motrin, Advil, Goody's, BC's, all herbal medications, fish oil, and non-prescription vitamins.                     Do NOT Smoke (Tobacco/Vaping) for 24 hours prior to your procedure.  If you use a CPAP at night, you may bring your mask/headgear for your overnight stay.   You will be asked to remove any contacts, glasses, piercing's, hearing aid's, dentures/partials prior to surgery. Please bring cases for these items if needed.    Patients discharged the day of surgery will not be allowed to drive home, and someone needs to stay with them for 24 hours.  SURGICAL WAITING ROOM VISITATION Patients may have no more than 2 support people in the waiting area - these visitors may rotate.   Pre-op nurse will coordinate an appropriate time for 1 ADULT support person, who  may not rotate, to accompany patient in pre-op.  Children under the age of 19 must have an adult with them who is not the patient and must remain in the main waiting area with an adult.  If the patient needs to stay at the hospital during part of their recovery, the visitor guidelines for inpatient rooms apply.  Please refer to the [facility] website for the visitor guidelines for any additional information.   If you received a COVID test during your pre-op visit  it is requested that you wear a mask when out in public, stay away from anyone that may not be feeling well and notify your surgeon if you develop symptoms. If you have been in contact with anyone that has tested positive in the last 10 days please notify you surgeon.      Pre-operative 5 CHG Bathing Instructions   You can play a key role in reducing the risk of infection after surgery. Your skin needs to be as free of germs as possible. You can reduce the number of germs on your skin by washing with CHG (chlorhexidine gluconate) soap before surgery. CHG is an antiseptic soap that kills germs and continues to kill germs even after washing.   DO NOT use if you have an allergy to chlorhexidine/CHG or antibacterial soaps. If your skin becomes reddened or irritated, stop using the CHG and notify one of our RNs at [phone].   Please shower with the CHG soap starting  4 days before surgery using the following schedule:     Please keep in mind the following:  DO NOT shave, including legs and underarms, starting the day of your first shower.   You may shave your face at any point before/day of surgery.  Place clean sheets on your bed the day you start using CHG soap. Use a clean washcloth (not used since being washed) for each shower. DO NOT sleep with pets once you start using the CHG.   CHG Shower Instructions:  Wash your face and private area with normal soap. If you choose to wash your hair, wash first with your normal  shampoo.  After you use shampoo/soap, rinse your hair and body thoroughly to remove shampoo/soap residue.  Turn the water OFF and apply about 3 tablespoons (45 ml) of CHG soap to a CLEAN washcloth.  Apply CHG soap ONLY FROM YOUR NECK DOWN TO YOUR TOES (washing for 3-5 minutes)  DO NOT use CHG soap on face, private areas, open wounds, or sores.  Pay special attention to the area where your surgery is being performed.  If you are having back surgery, having someone wash your back for you may be helpful. Wait 2 minutes after CHG soap is applied, then you may rinse off the CHG soap.  Pat dry with a clean towel  Put on clean clothes/pajamas   If you choose to wear lotion, please use ONLY the CHG-compatible lotions that are listed below.  Additional instructions for the day of surgery: DO NOT APPLY any lotions, deodorants, cologne, or perfumes.   Do not bring valuables to the hospital. [facility] is not responsible for any belongings/valuables. Do not wear nail polish, gel polish, artificial nails, or any other type of covering on natural nails (fingers and toes) Do not wear jewelry or makeup Put on clean/comfortable clothes.  Please brush your teeth.  Ask your nurse before applying any prescription medications to the skin.     CHG Compatible Lotions   Aveeno Moisturizing lotion  Cetaphil Moisturizing Cream  Cetaphil Moisturizing Lotion  Clairol Herbal Essence Moisturizing Lotion, Dry Skin  Clairol Herbal Essence Moisturizing Lotion, Extra Dry Skin  Clairol Herbal Essence Moisturizing Lotion, Normal Skin  Curel Age Defying Therapeutic Moisturizing Lotion with Alpha Hydroxy  Curel Extreme Care Body Lotion  Curel Soothing Hands Moisturizing Hand Lotion  Curel Therapeutic Moisturizing Cream, Fragrance-Free  Curel Therapeutic Moisturizing Lotion, Fragrance-Free  Curel Therapeutic Moisturizing Lotion, Original Formula  Eucerin Daily Replenishing Lotion  Eucerin Dry Skin Therapy Plus  Alpha Hydroxy Crme  Eucerin Dry Skin Therapy Plus Alpha Hydroxy Lotion  Eucerin Original Crme  Eucerin Original Lotion  Eucerin Plus Crme Eucerin Plus Lotion  Eucerin TriLipid Replenishing Lotion  Keri Anti-Bacterial Hand Lotion  Keri Deep Conditioning Original Lotion Dry Skin Formula Softly Scented  Keri Deep Conditioning Original Lotion, Fragrance Free Sensitive Skin Formula  Keri Lotion Fast Absorbing Fragrance Free Sensitive Skin Formula  Keri Lotion Fast Absorbing Softly Scented Dry Skin Formula  Keri Original Lotion  Keri Skin Renewal Lotion Keri Silky Smooth Lotion  Keri Silky Smooth Sensitive Skin Lotion  Nivea Body Creamy Conditioning Oil  Nivea Body Extra Enriched Lotion  Nivea Body Original Lotion  Nivea Body Sheer Moisturizing Lotion Nivea Crme  Nivea Skin Firming Lotion  NutraDerm 30 Skin Lotion  NutraDerm Skin Lotion  NutraDerm Therapeutic Skin Cream  NutraDerm Therapeutic Skin Lotion  ProShield Protective Hand Cream  Provon moisturizing lotion  Please read over the following fact sheets that you  were given.

## 2023-08-17 ENCOUNTER — Other Ambulatory Visit: Payer: Self-pay

## 2023-08-17 ENCOUNTER — Encounter (HOSPITAL_COMMUNITY)
Admission: RE | Admit: 2023-08-17 | Discharge: 2023-08-17 | Disposition: A | Discharge status: Home / Self Care | Source: Ambulatory Visit | Attending: Neurosurgery | Admitting: Neurosurgery

## 2023-08-17 ENCOUNTER — Encounter (HOSPITAL_COMMUNITY): Payer: Self-pay

## 2023-08-17 VITALS — BP 151/94 | HR 65 | Temp 98.5°F | Resp 17 | Ht 71.5 in | Wt 158.9 lb

## 2023-08-17 DIAGNOSIS — Z01818 Encounter for other preprocedural examination: Secondary | ICD-10-CM | POA: Diagnosis present

## 2023-08-17 HISTORY — DX: Unspecified osteoarthritis, unspecified site: M19.90

## 2023-08-17 LAB — CBC
HCT: 38.7 % — ABNORMAL LOW (ref 39.0–52.0)
Hemoglobin: 12.5 g/dL — ABNORMAL LOW (ref 13.0–17.0)
MCH: 28.1 pg (ref 26.0–34.0)
MCHC: 32.3 g/dL (ref 30.0–36.0)
MCV: 87 fL (ref 80.0–100.0)
Platelets: 246 10*3/uL (ref 150–400)
RBC: 4.45 MIL/uL (ref 4.22–5.81)
RDW: 13.5 % (ref 11.5–15.5)
WBC: 8.1 10*3/uL (ref 4.0–10.5)
nRBC: 0 % (ref 0.0–0.2)

## 2023-08-17 LAB — BASIC METABOLIC PANEL WITH GFR
Anion gap: 10 (ref 5–15)
BUN: 18 mg/dL (ref 8–23)
CO2: 25 mmol/L (ref 22–32)
Calcium: 9.2 mg/dL (ref 8.9–10.3)
Chloride: 104 mmol/L (ref 98–111)
Creatinine, Ser: 0.76 mg/dL (ref 0.61–1.24)
GFR, Estimated: >60 mL/min (ref >60)
Glucose, Bld: 103 mg/dL — ABNORMAL HIGH (ref 70–99)
Potassium: 4.5 mmol/L (ref 3.5–5.1)
Sodium: 139 mmol/L (ref 135–145)

## 2023-08-17 LAB — SURGICAL PCR SCREEN
MRSA, PCR: NEGATIVE
Staphylococcus aureus: POSITIVE — AB

## 2023-08-17 NOTE — Progress Notes (Signed)
 PCP - Charlet Conradi Cardiologist - denies  PPM/ICD - denies Device Orders -  Rep Notified -   Chest x-ray - na EKG - na Stress Test - denies ECHO - denies Cardiac Cath - denies  Sleep Study - denies CPAP -   Fasting Blood Sugar - na Checks Blood Sugar _____ times a day  Last dose of GLP1 agonist-  na GLP1 instructions: na  Blood Thinner Instructions:na Aspirin Instructions:na  ERAS Protcol -clear liquids until 1000 PRE-SURGERY Ensure or G2- no  COVID TEST- na   Anesthesia review: no  Patient denies shortness of breath, fever, cough and chest pain at PAT appointment   All instructions explained to the patient, with a verbal understanding of the material. Patient agrees to go over the instructions while at home for a better understanding.The opportunity to ask questions was provided.

## 2023-08-19 ENCOUNTER — Ambulatory Visit: Admitting: Student in an Organized Health Care Education/Training Program

## 2023-08-25 ENCOUNTER — Other Ambulatory Visit: Payer: Self-pay

## 2023-08-25 ENCOUNTER — Inpatient Hospital Stay (HOSPITAL_COMMUNITY)

## 2023-08-25 ENCOUNTER — Inpatient Hospital Stay (HOSPITAL_COMMUNITY): Admitting: Anesthesiology

## 2023-08-25 ENCOUNTER — Encounter (HOSPITAL_COMMUNITY): Payer: Self-pay | Admitting: Neurosurgery

## 2023-08-25 ENCOUNTER — Inpatient Hospital Stay (HOSPITAL_COMMUNITY)
Admission: RE | Admit: 2023-08-25 | Discharge: 2023-08-26 | DRG: 520 | Disposition: A | Discharge status: Home / Self Care | Source: Ambulatory Visit | Attending: Neurosurgery | Admitting: Neurosurgery

## 2023-08-25 ENCOUNTER — Encounter (HOSPITAL_COMMUNITY)
Admission: RE | Disposition: A | Discharge status: Home / Self Care | Payer: Self-pay | Source: Ambulatory Visit | Attending: Neurosurgery

## 2023-08-25 DIAGNOSIS — Y9389 Activity, other specified: Secondary | ICD-10-CM

## 2023-08-25 DIAGNOSIS — S32010A Wedge compression fracture of first lumbar vertebra, initial encounter for closed fracture: Secondary | ICD-10-CM

## 2023-08-25 DIAGNOSIS — M4856XA Collapsed vertebra, not elsewhere classified, lumbar region, initial encounter for fracture: Secondary | ICD-10-CM | POA: Diagnosis not present

## 2023-08-25 DIAGNOSIS — W1839XA Other fall on same level, initial encounter: Secondary | ICD-10-CM | POA: Diagnosis not present

## 2023-08-25 DIAGNOSIS — Z87891 Personal history of nicotine dependence: Secondary | ICD-10-CM | POA: Diagnosis not present

## 2023-08-25 DIAGNOSIS — Y92017 Garden or yard in single-family (private) house as the place of occurrence of the external cause: Secondary | ICD-10-CM | POA: Diagnosis not present

## 2023-08-25 DIAGNOSIS — Z880 Allergy status to penicillin: Secondary | ICD-10-CM

## 2023-08-25 HISTORY — PX: LAMINECTOMY WITH POSTERIOR LATERAL ARTHRODESIS LEVEL 2: SHX6336

## 2023-08-25 SURGERY — LAMINECTOMY WITH POSTERIOR LATERAL ARTHRODESIS LEVEL 2
Anesthesia: General | Site: Spine Lumbar

## 2023-08-25 MED ORDER — ONDANSETRON HCL 4 MG/2ML IJ SOLN
INTRAMUSCULAR | Status: AC
  Filled 2023-08-25: qty 2

## 2023-08-25 MED ORDER — CHLORHEXIDINE GLUCONATE 4 % EX SOLN: 1.0000 | CUTANEOUS | 1 refills | Status: DC

## 2023-08-25 MED ORDER — BUPIVACAINE HCL 0.5 % IJ SOLN
INTRAMUSCULAR | Status: DC | PRN
  Administered 2023-08-25: 15 mL

## 2023-08-25 MED ORDER — ACETAMINOPHEN 650 MG RE SUPP: 650.0000 mg | RECTAL | Status: DC | PRN

## 2023-08-25 MED ORDER — DIAZEPAM 5 MG PO TABS: 5.0000 mg | ORAL_TABLET | Freq: Four times a day (QID) | ORAL | Status: DC | PRN

## 2023-08-25 MED ORDER — PROPOFOL 10 MG/ML IV BOLUS
INTRAVENOUS | Status: AC
  Filled 2023-08-25: qty 20

## 2023-08-25 MED ORDER — SODIUM CHLORIDE 0.9% FLUSH
3.0000 mL | Freq: Two times a day (BID) | INTRAVENOUS | Status: DC
  Administered 2023-08-25: 3 mL via INTRAVENOUS

## 2023-08-25 MED ORDER — DEXAMETHASONE SODIUM PHOSPHATE 10 MG/ML IJ SOLN
INTRAMUSCULAR | Status: AC
  Filled 2023-08-25: qty 1

## 2023-08-25 MED ORDER — ACETAMINOPHEN 325 MG PO TABS
650.0000 mg | ORAL_TABLET | ORAL | Status: DC | PRN
  Administered 2023-08-25: 650 mg via ORAL
  Filled 2023-08-25 (×2): qty 2

## 2023-08-25 MED ORDER — OXYCODONE HCL 5 MG PO TABS
5.0000 mg | ORAL_TABLET | ORAL | Status: DC | PRN
  Filled 2023-08-25: qty 1

## 2023-08-25 MED ORDER — 0.9 % SODIUM CHLORIDE (POUR BTL) OPTIME
TOPICAL | Status: DC | PRN
  Administered 2023-08-25: 1000 mL

## 2023-08-25 MED ORDER — PHENYLEPHRINE 80 MCG/ML (10ML) SYRINGE FOR IV PUSH (FOR BLOOD PRESSURE SUPPORT)
PREFILLED_SYRINGE | INTRAVENOUS | Status: DC | PRN
  Administered 2023-08-25 (×2): 160 ug via INTRAVENOUS

## 2023-08-25 MED ORDER — OXYCODONE HCL 5 MG PO TABS: 10.0000 mg | ORAL_TABLET | ORAL | Status: DC | PRN

## 2023-08-25 MED ORDER — HYDROMORPHONE HCL 1 MG/ML IJ SOLN
INTRAMUSCULAR | Status: AC
Start: 2023-08-25 — End: ?
  Filled 2023-08-25: qty 1

## 2023-08-25 MED ORDER — MUPIROCIN 2 % EX OINT
1.0000 | TOPICAL_OINTMENT | Freq: Two times a day (BID) | CUTANEOUS | 0 refills | Status: DC
Start: 2023-08-25 — End: 2023-09-19

## 2023-08-25 MED ORDER — CHLORHEXIDINE GLUCONATE CLOTH 2 % EX PADS: 6.0000 | MEDICATED_PAD | Freq: Once | CUTANEOUS | Status: DC

## 2023-08-25 MED ORDER — HYDROMORPHONE HCL 1 MG/ML IJ SOLN
INTRAMUSCULAR | Status: AC
  Filled 2023-08-25: qty 1

## 2023-08-25 MED ORDER — CELECOXIB 200 MG PO CAPS
200.0000 mg | ORAL_CAPSULE | Freq: Once | ORAL | Status: AC
  Administered 2023-08-25: 200 mg via ORAL
  Filled 2023-08-25: qty 1

## 2023-08-25 MED ORDER — LIDOCAINE-EPINEPHRINE 0.5 %-1:200000 IJ SOLN
INTRAMUSCULAR | Status: AC
  Filled 2023-08-25: qty 50

## 2023-08-25 MED ORDER — ROCURONIUM BROMIDE 10 MG/ML (PF) SYRINGE
PREFILLED_SYRINGE | INTRAVENOUS | Status: DC | PRN
  Administered 2023-08-25: 40 mg via INTRAVENOUS
  Administered 2023-08-25: 15 mg via INTRAVENOUS
  Administered 2023-08-25: 20 mg via INTRAVENOUS

## 2023-08-25 MED ORDER — THROMBIN 20000 UNITS EX SOLR
CUTANEOUS | Status: AC
  Filled 2023-08-25: qty 20000

## 2023-08-25 MED ORDER — LIDOCAINE 2% (20 MG/ML) 5 ML SYRINGE
INTRAMUSCULAR | Status: AC
Start: 2023-08-25 — End: ?
  Filled 2023-08-25: qty 5

## 2023-08-25 MED ORDER — ACETAMINOPHEN 500 MG PO TABS
1000.0000 mg | ORAL_TABLET | Freq: Once | ORAL | Status: AC
Start: 2023-08-25 — End: 2023-08-25
  Administered 2023-08-25: 1000 mg via ORAL
  Filled 2023-08-25: qty 2

## 2023-08-25 MED ORDER — VANCOMYCIN HCL IN DEXTROSE 1-5 GM/200ML-% IV SOLN
1000.0000 mg | INTRAVENOUS | Status: AC
  Administered 2023-08-25: 1000 mg via INTRAVENOUS
  Filled 2023-08-25: qty 200

## 2023-08-25 MED ORDER — DROPERIDOL 2.5 MG/ML IJ SOLN: 0.6250 mg | Freq: Once | INTRAMUSCULAR | Status: DC | PRN

## 2023-08-25 MED ORDER — HYDROMORPHONE HCL 1 MG/ML IJ SOLN
0.2500 mg | INTRAMUSCULAR | Status: DC | PRN
  Administered 2023-08-25 (×4): 0.5 mg via INTRAVENOUS

## 2023-08-25 MED ORDER — PHENYLEPHRINE 80 MCG/ML (10ML) SYRINGE FOR IV PUSH (FOR BLOOD PRESSURE SUPPORT)
PREFILLED_SYRINGE | INTRAVENOUS | Status: AC
  Filled 2023-08-25: qty 10

## 2023-08-25 MED ORDER — FENTANYL CITRATE (PF) 250 MCG/5ML IJ SOLN
INTRAMUSCULAR | Status: DC | PRN
Start: 2023-08-25 — End: 2023-08-25
  Administered 2023-08-25: 100 ug via INTRAVENOUS
  Administered 2023-08-25 (×2): 50 ug via INTRAVENOUS

## 2023-08-25 MED ORDER — SUGAMMADEX SODIUM 200 MG/2ML IV SOLN
INTRAVENOUS | Status: DC | PRN
  Administered 2023-08-25: 200 mg via INTRAVENOUS

## 2023-08-25 MED ORDER — ONDANSETRON HCL 4 MG PO TABS
4.0000 mg | ORAL_TABLET | Freq: Four times a day (QID) | ORAL | Status: DC | PRN
  Administered 2023-08-26: 4 mg via ORAL
  Filled 2023-08-25: qty 1

## 2023-08-25 MED ORDER — ORAL CARE MOUTH RINSE: 15.0000 mL | Freq: Once | OROMUCOSAL | Status: AC

## 2023-08-25 MED ORDER — POTASSIUM CHLORIDE IN NACL 20-0.9 MEQ/L-% IV SOLN
INTRAVENOUS | Status: DC
  Filled 2023-08-25: qty 1000

## 2023-08-25 MED ORDER — BUPIVACAINE HCL (PF) 0.5 % IJ SOLN
INTRAMUSCULAR | Status: AC
  Filled 2023-08-25: qty 30

## 2023-08-25 MED ORDER — SODIUM CHLORIDE 0.9 % IV SOLN
250.0000 mL | INTRAVENOUS | Status: DC
  Administered 2023-08-25: 250 mL via INTRAVENOUS

## 2023-08-25 MED ORDER — ONDANSETRON HCL 4 MG/2ML IJ SOLN
4.0000 mg | Freq: Four times a day (QID) | INTRAMUSCULAR | Status: DC | PRN
  Administered 2023-08-25: 4 mg via INTRAVENOUS
  Filled 2023-08-25: qty 2

## 2023-08-25 MED ORDER — DEXAMETHASONE SODIUM PHOSPHATE 10 MG/ML IJ SOLN
INTRAMUSCULAR | Status: DC | PRN
  Administered 2023-08-25: 10 mg via INTRAVENOUS

## 2023-08-25 MED ORDER — CHLORHEXIDINE GLUCONATE 0.12 % MT SOLN
15.0000 mL | Freq: Once | OROMUCOSAL | Status: AC
  Administered 2023-08-25: 15 mL via OROMUCOSAL
  Filled 2023-08-25: qty 15

## 2023-08-25 MED ORDER — MENTHOL 3 MG MT LOZG: 1.0000 | LOZENGE | OROMUCOSAL | Status: DC | PRN

## 2023-08-25 MED ORDER — FENTANYL CITRATE (PF) 250 MCG/5ML IJ SOLN
INTRAMUSCULAR | Status: AC
  Filled 2023-08-25: qty 5

## 2023-08-25 MED ORDER — ONDANSETRON HCL 4 MG/2ML IJ SOLN
INTRAMUSCULAR | Status: DC | PRN
  Administered 2023-08-25: 4 mg via INTRAVENOUS

## 2023-08-25 MED ORDER — LIDOCAINE 2% (20 MG/ML) 5 ML SYRINGE
INTRAMUSCULAR | Status: DC | PRN
  Administered 2023-08-25: 60 mg via INTRAVENOUS

## 2023-08-25 MED ORDER — THROMBIN 20000 UNITS EX SOLR: CUTANEOUS | Status: DC | PRN

## 2023-08-25 MED ORDER — PHENYLEPHRINE HCL-NACL 20-0.9 MG/250ML-% IV SOLN
INTRAVENOUS | Status: DC | PRN
  Administered 2023-08-25: 25 ug/min via INTRAVENOUS

## 2023-08-25 MED ORDER — ROCURONIUM BROMIDE 10 MG/ML (PF) SYRINGE
PREFILLED_SYRINGE | INTRAVENOUS | Status: AC
  Filled 2023-08-25: qty 10

## 2023-08-25 MED ORDER — PHENOL 1.4 % MT LIQD: 1.0000 | OROMUCOSAL | Status: DC | PRN

## 2023-08-25 MED ORDER — PROPOFOL 10 MG/ML IV BOLUS
INTRAVENOUS | Status: DC | PRN
  Administered 2023-08-25: 130 mg via INTRAVENOUS

## 2023-08-25 MED ORDER — LACTATED RINGERS IV SOLN
INTRAVENOUS | Status: DC | PRN
Start: 2023-08-25 — End: 2023-08-25

## 2023-08-25 MED ORDER — SODIUM CHLORIDE 0.9% FLUSH
3.0000 mL | INTRAVENOUS | Status: DC | PRN
Start: 2023-08-25 — End: 2023-08-26

## 2023-08-25 MED ORDER — LIDOCAINE-EPINEPHRINE 0.5 %-1:200000 IJ SOLN
INTRAMUSCULAR | Status: DC | PRN
  Administered 2023-08-25: 16 mL

## 2023-08-25 SURGICAL SUPPLY — 58 items
BAG COUNTER SPONGE SURGICOUNT (BAG) ×1 IMPLANT
BENZOIN TINCTURE PRP APPL 2/3 (GAUZE/BANDAGES/DRESSINGS) IMPLANT
BLADE CLIPPER SURG (BLADE) IMPLANT
BUR MATCHSTICK NEURO 3.0 LAGG (BURR) ×1 IMPLANT
BUR PRECISION FLUTE 5.0 (BURR) IMPLANT
CANISTER SUCTION 3000ML PPV (SUCTIONS) ×1 IMPLANT
CEMENT KYPHON C01A KIT/MIXER (Cement) IMPLANT
CEMENT KYPHON CX01A KIT/MIXER (Cement) IMPLANT
CEMENT VERTAPLEX W/MIXER 20G (Cement) IMPLANT
DERMABOND ADVANCED .7 DNX12 (GAUZE/BANDAGES/DRESSINGS) ×1 IMPLANT
DRAPE C-ARM 42X72 X-RAY (DRAPES) ×2 IMPLANT
DRAPE LAPAROTOMY 100X72X124 (DRAPES) ×1 IMPLANT
DRAPE MICROSCOPE SLANT 54X150 (MISCELLANEOUS) ×1 IMPLANT
DRAPE SURG 17X23 STRL (DRAPES) ×1 IMPLANT
DRSG OPSITE POSTOP 4X8 (GAUZE/BANDAGES/DRESSINGS) IMPLANT
DURAPREP 26ML APPLICATOR (WOUND CARE) ×1 IMPLANT
ELECTRODE REM PT RTRN 9FT ADLT (ELECTROSURGICAL) ×1 IMPLANT
GAUZE 4X4 16PLY ~~LOC~~+RFID DBL (SPONGE) IMPLANT
GAUZE SPONGE 4X4 12PLY STRL (GAUZE/BANDAGES/DRESSINGS) IMPLANT
GLOVE ECLIPSE 6.5 STRL STRAW (GLOVE) ×1 IMPLANT
GLOVE EXAM NITRILE XL STR (GLOVE) IMPLANT
GOWN STRL REUS W/ TWL LRG LVL3 (GOWN DISPOSABLE) ×2 IMPLANT
GOWN STRL REUS W/ TWL XL LVL3 (GOWN DISPOSABLE) IMPLANT
GOWN STRL REUS W/TWL 2XL LVL3 (GOWN DISPOSABLE) IMPLANT
GUIDEWIRE NITI 1.4X495 2-PK (WIRE) IMPLANT
KIT BASIN OR (CUSTOM PROCEDURE TRAY) ×1 IMPLANT
KIT TURNOVER KIT B (KITS) ×1 IMPLANT
NDL BIOPSY DD SERENGETI 8G (NEEDLE) IMPLANT
NDL HYPO 25X1 1.5 SAFETY (NEEDLE) ×1 IMPLANT
NDL SPNL 18GX3.5 QUINCKE PK (NEEDLE) IMPLANT
NEEDLE BIOPSY DD SERENGETI 8G (NEEDLE) ×2 IMPLANT
NEEDLE HYPO 25X1 1.5 SAFETY (NEEDLE) ×1 IMPLANT
NEEDLE SPNL 18GX3.5 QUINCKE PK (NEEDLE) IMPLANT
NS IRRIG 1000ML POUR BTL (IV SOLUTION) ×1 IMPLANT
PACK LAMINECTOMY NEURO (CUSTOM PROCEDURE TRAY) ×1 IMPLANT
PACK NDL FENS EVER 10-15G (MISCELLANEOUS) IMPLANT
PACK NEEDLE FENS EVER 10-15G (MISCELLANEOUS) ×4 IMPLANT
PACK PLUNGER EVER SHORT 10G (MISCELLANEOUS) IMPLANT
PAD ARMBOARD POSITIONER FOAM (MISCELLANEOUS) ×3 IMPLANT
PATTIES SURGICAL .5 X.5 (GAUZE/BANDAGES/DRESSINGS) ×1 IMPLANT
PATTIES SURGICAL 1X1 (DISPOSABLE) ×1 IMPLANT
ROD CONT BN EVEREST 5.5X75 (Rod) IMPLANT
ROD CONT BULLET HEX 5.5X80 (Rod) IMPLANT
SCREW PA FENS EVEREST 6.5X35 (Screw) IMPLANT
SCREW PA FENS EVEREST 6.5X40 (Screw) IMPLANT
SET SCREW VRST (Screw) IMPLANT
SPIKE FLUID TRANSFER (MISCELLANEOUS) ×1 IMPLANT
SPONGE SURGIFOAM ABS GEL SZ50 (HEMOSTASIS) ×1 IMPLANT
SPONGE T-LAP 4X18 ~~LOC~~+RFID (SPONGE) IMPLANT
STRIP CLOSURE SKIN 1/2X4 (GAUZE/BANDAGES/DRESSINGS) IMPLANT
SUT VIC AB 0 CT1 18XCR BRD8 (SUTURE) ×1 IMPLANT
SUT VIC AB 2-0 CT1 18 (SUTURE) ×1 IMPLANT
SUT VIC AB 3-0 SH 8-18 (SUTURE) ×1 IMPLANT
SYR 30ML SLIP (SYRINGE) ×1 IMPLANT
SYSTEM AUTOPLEX VERTAPLEX 10G (CANNULA) IMPLANT
TOWEL GREEN STERILE (TOWEL DISPOSABLE) ×1 IMPLANT
TOWEL GREEN STERILE FF (TOWEL DISPOSABLE) ×1 IMPLANT
WATER STERILE IRR 1000ML POUR (IV SOLUTION) ×1 IMPLANT

## 2023-08-25 NOTE — Transfer of Care (Signed)
 Immediate Anesthesia Transfer of Care Note  Patient: Thomas Greer  Procedure(s) Performed: LAMINECTOMY WITH POSTERIOR LATERAL ARTHRODESIS THORACIC TWELVE-LUMBAR TWO (Spine Lumbar)  Patient Location: PACU  Anesthesia Type:General  Level of Consciousness: sedated, drowsy, and responds to stimulation  Airway & Oxygen Therapy: Patient Spontanous Breathing and Patient connected to face mask oxygen  Post-op Assessment: Report given to RN, Post -op Vital signs reviewed and stable, and Patient moving all extremities X 4  Post vital signs: Reviewed and stable  Last Vitals:  Vitals Value Taken Time  BP 133/66 08/25/23 1652  Temp    Pulse 68 08/25/23 1656  Resp 15 08/25/23 1656  SpO2 99 % 08/25/23 1656  Vitals shown include unfiled device data.  Last Pain:  Vitals:   08/25/23 1132  TempSrc:   PainSc: 0-No pain      Patients Stated Pain Goal: 0 (08/25/23 1120)  Complications: No notable events documented.

## 2023-08-25 NOTE — Op Note (Signed)
 08/25/2023  8:28 PM  PATIENT:  Thomas Greer  78 y.o. male With a non healing L1 compression fracture. I have recommended percutaneous fixation of the fracture, and ORIF PRE-OPERATIVE DIAGNOSIS:  Closed wedge compression fracture of L1 vertebra  POST-OPERATIVE DIAGNOSIS:  Closed wedge compression fracture of L1 vertebra  PROCEDURE:  Procedure(s): Open reduction internal fixation L1 compression fracture.  Posterior fixation thoracic 12, Lumbar one(left pedicle screw)and L2  SURGEON: Surgeon(s): Audie Bleacher, MD  ASSISTANTS:none  ANESTHESIA:   general  EBL:  No intake/output data recorded.  BLOOD ADMINISTERED:none  CELL SAVER GIVEN:not used  COUNT:correct   DRAINS: none   SPECIMEN:  No Specimen  DICTATION: [name] was taken to the operating room, intubated, and placed under a general anesthetic without difficulty. He was positioned prone on the Gray table. His back was prepped and draped in a sterile manner. Using fluoroscopy in the AP plane I located the incision sites for the pedicles at L1, left sided only, T12 and L2 bilaterally. All screws were placed in the same manner. I made the skin incision, and with AP fluoroscopy placed a Jamshidi needle into the pedicle. I checked the lateral fluoroscopic view to ensure good placement of the needle. I placed a K wire through the needle and removed the needle. I used 6.60mm x 40 mm screws at L2 and T12. I used a 6.5 x 35mm screw on the left at L1.  I injected methylmethacrylate into the T12 and L2 screws to augment their purchase in the bone. I placed rods through the towers and secured them to the tulips with locking caps. Fluoroscopy confirmed rod placement on both sides. I closed all the wounds after infiltrating Marcaine into the incisions.  I used vicryl sutures to close the incisions in layered fashion. I applied dermabond and an occlusive bandage for a sterile dressing. He was rolled onto the bed, extubated, and taken to the  PACU. He was moving all extremities well  PLAN OF CARE: Admit to inpatient   PATIENT DISPOSITION:  PACU - hemodynamically stable.   Delay start of Pharmacological VTE agent (>24hrs) due to surgical blood loss or risk of bleeding:  no

## 2023-08-25 NOTE — H&P (Signed)
 BP (!) 152/71 (BP Location: Right Arm)   Pulse 66   Temp 98 F (36.7 C) (Oral)   Resp 18   Ht 5' 11.5" (1.816 m)   Wt 70.3 kg   SpO2 97%   BMI 21.32 kg/m  Thomas Greer is sent to us  today secondary to an L1 compression fracture, which he sustained while he was cleaning some tree limbs from his backyard.  He lifted 1 up and was moving backwards when the tree limb snapped and he fell backwards holding the tree limb in front of him.  At that time, he had severe pain in his back.  X-rays showed a fracture at L1.  He had significant and sustained pain, which was not relieved by anything.  This led to his primary care physician prescribing medication that also did not help and he finally sent him for an MRI of the lumbar spine and it shows further compression of the fracture at L1 on May 9th.  He also has in addition to an unhealed compression fracture at L1, sacral fractures with a transverse fracture that extends through the sacrum at the upper S2 level with again marrow edema and presacral edema.  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  I had planned on taking Thomas Greer to the operating room to place screws at T12 and L2 secondary to the compression fracture, but I think he will also need to have something done to the sacral fracture for complete relief but impossible to know which one is causing most of the pain.   The patient was felt to be depressed secondary to this fracture as he was unable to work and work is a big part of his overall well-being.  He says he is frustrated since he still works that he was unable to do so at this point.  He was started on Cymbalta .  Oxycodone  was not working well.  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  He has had difficulty sleeping.  He essentially has had difficulty with all aspects of life.  He was tearful appearing and was tearful during  the interview.  His mood was certainly better today with me, but the frustration he had was certainly present.   On exam, he is alert, oriented by 4.  He walks stooped, has a cane.  Says he did not use a cane until this fracture a month ago.  He is steady on the cane.  Normal muscle tone, bulk, coordination.  He is quite strong in the upper and lower extremities.  Memory, language, attention span, and fund of knowledge are normal.  Speech is clear, it is also fluent.  Pupils equal, round, and reactive to light.  Full extraocular movements.  Full visual fields.  Symmetric facial movements and sensation.  Hearing intact to voice.   There is a near complete compression of the L1 vertebral body.  There is absolutely no opportunity for a kyphoplasty here and he would need to have hardware above and below.  I am planning on this again next week, Friday.  But I will also speak to my colleague in Radiology, Dr. Vanda Gemma, about the sacral fractures as he has been able to do the tuboplasties on the sacrum also.  It might be something he could do while I have him in the hospital for the open reduction internal fixation of the L1 compression fracture when  I place pedicle screws at T12 and L1.  I will see if we can coordinate that.  He has taken Tylenol, ibuprofen, and said he stopped taking the Cymbalta .

## 2023-08-25 NOTE — Anesthesia Preprocedure Evaluation (Addendum)
 Anesthesia Evaluation  Patient identified by MRN, date of birth, ID band Patient awake    Reviewed: Allergy & Precautions, H&P , NPO status , Patient's Chart, lab work & pertinent test results  Airway Mallampati: I  TM Distance: >3 FB Neck ROM: Full    Dental no notable dental hx. (+) Edentulous Upper, Edentulous Lower, Dental Advisory Given   Pulmonary neg pulmonary ROS, former smoker   Pulmonary exam normal breath sounds clear to auscultation       Cardiovascular negative cardio ROS  Rhythm:Regular Rate:Normal     Neuro/Psych negative neurological ROS  negative psych ROS   GI/Hepatic negative GI ROS, Neg liver ROS,,,  Endo/Other  negative endocrine ROS    Renal/GU negative Renal ROS  negative genitourinary   Musculoskeletal  (+) Arthritis ,  L1 wedge fracture    Abdominal   Peds  Hematology negative hematology ROS (+) Lab Results      Component                Value               Date                      WBC                      8.1                 08/17/2023                HGB                      12.5 (L)            08/17/2023                HCT                      38.7 (L)            08/17/2023                MCV                      87.0                08/17/2023                PLT                      246                 08/17/2023             Lab Results      Component                Value               Date                      NA                       139                 08/17/2023                K  4.5                 08/17/2023                CO2                      25                  08/17/2023                GLUCOSE                  103 (H)             08/17/2023                BUN                      18                  08/17/2023                CREATININE               0.76                08/17/2023                CALCIUM                  9.2                 08/17/2023                 GFR                      85.82               03/04/2022                GFRNONAA                 >60                 08/17/2023              Anesthesia Other Findings   Reproductive/Obstetrics negative OB ROS                             Anesthesia Physical Anesthesia Plan  ASA: 2  Anesthesia Plan: General   Post-op Pain Management: Celebrex PO (pre-op)* and Tylenol PO (pre-op)*   Induction: Intravenous  PONV Risk Score and Plan: 2 and Ondansetron, Dexamethasone and Treatment may vary due to age or medical condition  Airway Management Planned: Mask and Oral ETT  Additional Equipment: None  Intra-op Plan:   Post-operative Plan: Extubation in OR  Informed Consent: I have reviewed the patients History and Physical, chart, labs and discussed the procedure including the risks, benefits and alternatives for the proposed anesthesia with the patient or authorized representative who has indicated his/her understanding and acceptance.     Dental advisory given  Plan Discussed with: CRNA  Anesthesia Plan Comments:        Anesthesia Quick Evaluation

## 2023-08-25 NOTE — Anesthesia Procedure Notes (Signed)
 Procedure Name: Intubation Date/Time: 08/25/2023 2:16 PM  Performed by: Alphia Jasmine, CRNAPre-anesthesia Checklist: Patient identified, Emergency Drugs available, Suction available, Timeout performed and Patient being monitored Patient Re-evaluated:Patient Re-evaluated prior to induction Oxygen Delivery Method: Circle system utilized Preoxygenation: Pre-oxygenation with 100% oxygen Induction Type: IV induction Ventilation: Mask ventilation without difficulty Laryngoscope Size: Mac and 4 Grade View: Grade I Tube type: Oral Tube size: 7.5 mm Number of attempts: 1 Airway Equipment and Method: Stylet Placement Confirmation: ETT inserted through vocal cords under direct vision, positive ETCO2, CO2 detector and breath sounds checked- equal and bilateral Secured at: 22 cm Tube secured with: Tape Dental Injury: Teeth and Oropharynx as per pre-operative assessment

## 2023-08-26 ENCOUNTER — Encounter (HOSPITAL_COMMUNITY): Payer: Self-pay | Admitting: Neurosurgery

## 2023-08-26 ENCOUNTER — Other Ambulatory Visit (HOSPITAL_COMMUNITY): Payer: Self-pay

## 2023-08-26 MED ORDER — TIZANIDINE HCL 4 MG PO TABS: 4.0000 mg | ORAL_TABLET | Freq: Four times a day (QID) | ORAL | 0 refills | Status: DC | PRN

## 2023-08-26 MED ORDER — TRAMADOL HCL 50 MG PO TABS
50.0000 mg | ORAL_TABLET | Freq: Four times a day (QID) | ORAL | Status: DC | PRN
  Administered 2023-08-26 (×2): 50 mg via ORAL
  Filled 2023-08-26 (×2): qty 1

## 2023-08-26 MED ORDER — TRAMADOL HCL 50 MG PO TABS
50.0000 mg | ORAL_TABLET | Freq: Four times a day (QID) | ORAL | 0 refills | Status: DC | PRN
  Filled 2023-08-26: qty 30, 4d supply, fill #0

## 2023-08-26 MED ORDER — TRAMADOL HCL 50 MG PO TABS: 50.0000 mg | ORAL_TABLET | Freq: Four times a day (QID) | ORAL | 0 refills | Status: DC | PRN

## 2023-08-26 NOTE — Progress Notes (Signed)
 Patient alert and oriented, void, ambulate, VSS. Surgical site clean and dry no sign of infection. D/c instructions explain and given to the patient all questions answered.

## 2023-08-26 NOTE — Discharge Summary (Signed)
 Physician Discharge Summary  Patient ID: Thomas Greer MRN: [id number] DOB/AGE: 78/07/1945 78 y.o.  Admit date: 08/25/2023 Discharge date: 08/26/2023  Admission Diagnoses:non healing compression fracture L1  Discharge Diagnoses:  Principal Problem:   Closed compression fracture of body of L1 vertebra [facility])   Discharged Condition: good  Hospital Course: Thomas Greer was admitted and taken to the operating room for an uncomplicated percutaneous pedicle screw T12-L2  ORIF of an L1 fracture.   Treatments: surgery: Open reduction internal fixation L1 compression fracture.  Posterior fixation thoracic 12, Lumbar one(left pedicle screw)and L2  Discharge Exam: Blood pressure 125/64, pulse 71, temperature 98.8 F (37.1 C), temperature source Oral, resp. rate 19, height 5' 11.5" (1.816 m), weight 70.3 kg, SpO2 98%. General appearance: alert, cooperative, appears stated age, and no distress  Disposition: Discharge disposition: 01-Home or Self Care      Closed wedge compression fracture of L1 vertebra  Allergies as of 08/26/2023       Reactions   Oxycodone  Hcl Anxiety   Penicillins Swelling        Medication List     PAUSE taking these medications    ibuprofen 200 MG tablet Wait to take this until: September 12, 2023 Commonly known as: ADVIL Take 200 mg by mouth every 6 (six) hours as needed for moderate pain (pain score 4-6).       TAKE these medications    acetaminophen 500 MG tablet Commonly known as: TYLENOL Take 500 mg by mouth every 6 (six) hours as needed for moderate pain (pain score 4-6).   alendronate  70 MG tablet Commonly known as: FOSAMAX  Take 1 tablet (70 mg total) by mouth every 7 (seven) days. Take with a full glass of water on an empty stomach.   chlorhexidine 4 % external liquid Commonly known as: HIBICLENS Apply 15 mLs (1 Application total) topically as directed for 30 doses. Use as directed daily for 5 days every other week for 6 weeks.   DULoxetine  30 MG  capsule Commonly known as: Cymbalta  Take 1 capsule (30 mg total) by mouth daily.   ELDERBERRY PO Take 1 tablet by mouth daily.   mupirocin ointment 2 % Commonly known as: BACTROBAN Place 1 Application into the nose 2 (two) times daily for 60 doses. Use as directed 2 times daily for 5 days every other week for 6 weeks.   tamsulosin  0.4 MG Caps capsule Commonly known as: FLOMAX  Take 1 capsule (0.4 mg total) by mouth daily.   tiZANidine  4 MG tablet Commonly known as: ZANAFLEX  Take 1 tablet (4 mg total) by mouth every 6 (six) hours as needed for muscle spasms. What changed: Another medication with the same name was added. Make sure you understand how and when to take each.   tiZANidine  4 MG tablet Commonly known as: ZANAFLEX  Take 1 tablet (4 mg total) by mouth every 6 (six) hours as needed for muscle spasms. What changed: You were already taking a medication with the same name, and this prescription was added. Make sure you understand how and when to take each.   traMADol 50 MG tablet Commonly known as: ULTRAM Take 1-2 tablets (50-100 mg total) by mouth every 6 (six) hours as needed for moderate pain (pain score 4-6) or severe pain (pain score 7-10).        Follow-up Information     Audie Bleacher, MD Follow up.   Specialty: Neurosurgery Why: keep your scheduled appointment Contact information: [address] [place] [address] [phone]  Signed: Audie Bleacher 08/26/2023, 8:58 AM

## 2023-08-26 NOTE — Discharge Instructions (Signed)

## 2023-08-26 NOTE — Evaluation (Signed)
 Occupational Therapy Evaluation Patient Details Name: Thomas Greer MRN: [id number] DOB: [birth year] Today's Date: 08/26/2023   History of Present Illness   78 yo male with L1 compression fx  s/p 5/29 ORIF posterior fixation T12- L1 with L pedicle screw and L2 PMH adjustment disorder     Clinical Impressions Patient evaluated by Occupational Therapy with no further acute OT needs identified. All education has been completed and the patient has no further questions. See below for any follow-up Occupational Therapy or equipment needs. OT to sign off. Thank you for referral.       If plan is discharge home, recommend the following:   Assist for transportation     Functional Status Assessment   Patient has had a recent decline in their functional status and demonstrates the ability to make significant improvements in function in a reasonable and predictable amount of time.     Equipment Recommendations   None recommended by OT     Recommendations for Other Services         Precautions/Restrictions   Precautions Precautions: Back Recall of Precautions/Restrictions: Intact Precaution/Restrictions Comments: handout provided and reviewed for back precautions Restrictions Weight Bearing Restrictions Per Provider Order: No     Mobility Bed Mobility Overal bed mobility:  (oob on arrival)                  Transfers Overall transfer level: Needs assistance Equipment used: Rolling walker (2 wheels) Transfers: Sit to/from Stand Sit to Stand: Supervision           General transfer comment: cues for hand placement and scooting to edge of chair first to help power up with less arm use      Balance Overall balance assessment: Modified Independent                                         ADL either performed or assessed with clinical judgement   ADL Overall ADL's : Modified independent                                        General ADL Comments: dressing for home and educated on back precautions with figure 4 cross method. pt with cues to keep head up with RW with movement. pt fully dressed with only verbal cues for new precautions   Back handout provided and reviewed adls in detail. Pt educated on: set an alarm at night for medication, avoid sitting for long periods of time, correct bed positioning for sleeping, correct sequence for bed mobility, avoiding lifting more than 5 pounds and never wash directly over incision. All education is complete and patient indicates understanding.   Vision Baseline Vision/History: 1 Wears glasses Ability to See in Adequate Light: 0 Adequate Vision Assessment?: No apparent visual deficits     Perception         Praxis         Pertinent Vitals/Pain Pain Assessment Pain Assessment: Faces Faces Pain Scale: Hurts little more Pain Location: back at incision Pain Descriptors / Indicators: Discomfort Pain Intervention(s): Monitored during session, Premedicated before session, Repositioned, Limited activity within patient's tolerance     Extremity/Trunk Assessment Upper Extremity Assessment Upper Extremity Assessment: Overall WFL for tasks assessed   Lower Extremity Assessment Lower Extremity Assessment: Defer to PT evaluation  Cervical / Trunk Assessment Cervical / Trunk Assessment: Back Surgery   Communication Communication Communication: No apparent difficulties   Cognition Arousal: Alert Behavior During Therapy: WFL for tasks assessed/performed Cognition: No apparent impairments                               Following commands: Intact       Cueing  General Comments      incision dry and dressing in tact. pt with photo of dressing on personal phone for education purposes   Exercises     Shoulder Instructions      Home Living Family/patient expects to be discharged to:: Private residence Living Arrangements: Spouse/significant  other Available Help at Discharge: Family Type of Home: House Home Access: Stairs to enter Secretary/administrator of Steps: 6 Entrance Stairs-Rails: Left Home Layout: Two level;Bed/bath upstairs;Able to live on main level with bedroom/bathroom;Full bath on main level Alternate Level Stairs-Number of Steps: 14 Alternate Level Stairs-Rails: Left Bathroom Shower/Tub: Walk-in shower;Tub/shower unit (shower on main level- tub on second floor)   Bathroom Toilet: Handicapped height (standard on second floor)     Home Equipment: Rolling Walker (2 wheels);Cane - single point;Adaptive equipment;BSC/3in1;Shower seat Adaptive Equipment: Reacher Additional Comments: wife with fairly recent TIA , daughter in law also present during session for education      Prior Functioning/Environment Prior Level of Function : Independent/Modified Independent;Working/employed;Driving             Mobility Comments: uses cane      OT Problem List:     OT Treatment/Interventions:        OT Goals(Current goals can be found in the care plan section)   Acute Rehab OT Goals Patient Stated Goal: to go home and recover Potential to Achieve Goals: Good   OT Frequency:       Co-evaluation              AM-PAC OT "6 Clicks" Daily Activity     Outcome Measure Help from another person eating meals?: None Help from another person taking care of personal grooming?: None Help from another person toileting, which includes using toliet, bedpan, or urinal?: None Help from another person bathing (including washing, rinsing, drying)?: None Help from another person to put on and taking off regular upper body clothing?: None Help from another person to put on and taking off regular lower body clothing?: None 6 Click Score: 24   End of Session Equipment Utilized During Treatment: Rolling walker (2 wheels) Nurse Communication: Mobility status  Activity Tolerance: Patient tolerated treatment well Patient  left: in chair;with call bell/phone within reach;with family/visitor present (MD Cabbell arriving to discuss post op)  OT Visit Diagnosis: Unsteadiness on feet (R26.81)                Time: 7564-3329 OT Time Calculation (min): 35 min Charges:  OT General Charges $OT Visit: 1 Visit OT Evaluation $OT Eval Moderate Complexity: 1 Mod OT Treatments $Self Care/Home Management : 8-22 mins   Brynn, OTR/L  Acute Rehabilitation Services Office: [phone] .   Neomia Banner 08/26/2023, 9:48 AM

## 2023-08-26 NOTE — Anesthesia Postprocedure Evaluation (Signed)
 Anesthesia Post Note  Patient: Thomas Greer  Procedure(s) Performed: LAMINECTOMY WITH POSTERIOR LATERAL ARTHRODESIS THORACIC TWELVE-LUMBAR TWO (Spine Lumbar)     Patient location during evaluation: PACU Anesthesia Type: General Level of consciousness: awake and alert Pain management: pain level controlled Vital Signs Assessment: post-procedure vital signs reviewed and stable Respiratory status: spontaneous breathing, nonlabored ventilation, respiratory function stable and patient connected to nasal cannula oxygen Cardiovascular status: blood pressure returned to baseline and stable Postop Assessment: no apparent nausea or vomiting Anesthetic complications: no   No notable events documented.  Last Vitals:  Vitals:   08/26/23 0331 08/26/23 0748  BP: (!) 152/64 125/64  Pulse: 67 71  Resp: 18 19  Temp: 37.2 C 37.1 C  SpO2: 100% 98%    Last Pain:  Vitals:   08/26/23 1034  TempSrc:   PainSc: 6                  Lethaniel Rave

## 2023-08-26 NOTE — Evaluation (Signed)
 Physical Therapy Brief Evaluation and Discharge Note Patient Details Name: Thomas Greer MRN: [id number] DOB: 04/11/1945 Today's Date: 08/26/2023   History of Present Illness  78 yo male with L1 compression fx  s/p 5/29 ORIF posterior fixation T12- L1 with L pedicle screw and L2 PMH adjustment disorder  Clinical Impression  Pt admitted with above diagnosis. Pt was able to demonstrate safety in all aspects of mobility. Could recite back precautions as well. Plans to d/c today.  No further PT needs at this time. Did issue gait belt for wife to use to guard pt with steps.  Wife will be present to assist pt prn.    PT Assessment Patient does not need any further PT services  Assistance Needed at Discharge  PRN    Equipment Recommendations None recommended by PT  Recommendations for Other Services       Precautions/Restrictions Precautions Precautions: Back Recall of Precautions/Restrictions: Intact Precaution/Restrictions Comments: handout provided and reviewed for back precautions Restrictions Weight Bearing Restrictions Per Provider Order: No        Mobility  Bed Mobility Rolling: Supervision     General bed mobility comments: cues for log roll  Transfers Overall transfer level: Needs assistance Equipment used: Rolling walker (2 wheels) Transfers: Sit to/from Stand Sit to Stand: Supervision           General transfer comment: cues for hand placement and scooting to edge of chair first to help power up with less arm use    Ambulation/Gait Ambulation/Gait assistance: Supervision Gait Distance (Feet): 180 Feet Assistive device: Rolling walker (2 wheels) Gait Pattern/deviations: Step-through pattern, Decreased stride length, Trunk flexed   General Gait Details: Pt safe overall with ambulation without LOB with use of RW.  Home Activity Instructions    Stairs Stairs: Yes Stairs assistance: Contact guard assist Stair Management: One rail Left, Step to pattern,  Forwards Number of Stairs: 5 General stair comments: Pt able to ascend and descend stairs with left rail and cane in other hand. No LOB and pt familiar with technique.  Issued gait belt for wife to use to guard pt with stairs.  Modified Rankin (Stroke Patients Only)        Balance Overall balance assessment: Needs assistance Sitting-balance support: No upper extremity supported, Feet supported Sitting balance-Leahy Scale: Fair     Standing balance support: Bilateral upper extremity supported, During functional activity, Reliant on assistive device for balance Standing balance-Leahy Scale: Fair Standing balance comment: can stand statically without device however prefers to use RW for steadiness with ambulationa nd standing          Pertinent Vitals/Pain PT - Brief Vital Signs All Vital Signs Stable: Yes Pain Assessment Pain Assessment: Faces Faces Pain Scale: Hurts little more Pain Location: back at incision Pain Descriptors / Indicators: Discomfort Pain Intervention(s): Limited activity within patient's tolerance, Monitored during session, Repositioned     Home Living Family/patient expects to be discharged to:: Private residence Living Arrangements: Spouse/significant other Available Help at Discharge: Family;Available 24 hours/day Home Environment: Stairs to enter;Rail - left (has flight of stairs to go upstairs to bedroom but will stay on main level until healed)  Stairs-Number of Steps: 5 Home Equipment: Rolling Walker (2 wheels);Cane - single point;Adaptive equipment;BSC/3in1;Shower seat Adaptive Equipment: Reacher Additional Comments: wife with fairly recent TIA , daughter in law also present during session for education.  Pt sleeps in a recliner and will continue this until healed.    Prior Function Level of Independence: Independent  UE/LE Assessment   UE ROM/Strength/Tone/Coordination: WFL    LE ROM/Strength/Tone/Coordination: Impaired LE  ROM/Strength/Tone/Coordination Deficits: Right LE weakness but still grossly 3/5    Communication   Communication Communication: No apparent difficulties     Cognition Overall Cognitive Status: Appears within functional limits for tasks assessed/performed       General Comments      Exercises     Assessment/Plan    PT Problem List         PT Visit Diagnosis Muscle weakness (generalized) (M62.81)    No Skilled PT All education completed;Patient at baseline level of functioning;Patient is supervision for all activity/mobility   Co-evaluation                AMPAC 6 Clicks Help needed turning from your back to your side while in a flat bed without using bedrails?: None Help needed moving from lying on your back to sitting on the side of a flat bed without using bedrails?: None Help needed moving to and from a bed to a chair (including a wheelchair)?: None Help needed standing up from a chair using your arms (e.g., wheelchair or bedside chair)?: A Little Help needed to walk in hospital room?: A Little Help needed climbing 3-5 steps with a railing? : A Little 6 Click Score: 21      End of Session Equipment Utilized During Treatment: Gait belt Activity Tolerance: Patient tolerated treatment well Patient left: in chair;with call bell/phone within reach;with family/visitor present Nurse Communication: Mobility status PT Visit Diagnosis: Muscle weakness (generalized) (M62.81)     Time: 1610-9604 PT Time Calculation (min) (ACUTE ONLY): 21 min  Charges:   PT Evaluation $PT Eval Low Complexity: 1 Low      Lauretta Sallas M,PT Acute Rehab Services [phone]   [name]  08/26/2023, 12:55 PM

## 2023-08-29 ENCOUNTER — Ambulatory Visit: Admitting: Student in an Organized Health Care Education/Training Program

## 2023-09-02 ENCOUNTER — Encounter: Payer: Self-pay | Admitting: Student in an Organized Health Care Education/Training Program

## 2023-09-02 ENCOUNTER — Ambulatory Visit: Admitting: Student in an Organized Health Care Education/Training Program

## 2023-09-02 VITALS — BP 153/92 | HR 71 | Wt 157.0 lb

## 2023-09-02 DIAGNOSIS — S3210XD Unspecified fracture of sacrum, subsequent encounter for fracture with routine healing: Secondary | ICD-10-CM

## 2023-09-02 DIAGNOSIS — S32010A Wedge compression fracture of first lumbar vertebra, initial encounter for closed fracture: Secondary | ICD-10-CM | POA: Diagnosis not present

## 2023-09-02 DIAGNOSIS — M8000XA Age-related osteoporosis with current pathological fracture, unspecified site, initial encounter for fracture: Secondary | ICD-10-CM

## 2023-09-02 MED ORDER — TRAMADOL HCL 50 MG PO TABS
50.0000 mg | ORAL_TABLET | Freq: Three times a day (TID) | ORAL | 0 refills | Status: DC | PRN
Start: 2023-09-02 — End: 2023-09-02

## 2023-09-02 MED ORDER — SENNA-DOCUSATE SODIUM 8.6-50 MG PO TABS: 2.0000 | ORAL_TABLET | Freq: Every day | ORAL | 0 refills | Status: DC

## 2023-09-02 MED ORDER — TRAMADOL HCL 50 MG PO TABS: 50.0000 mg | ORAL_TABLET | Freq: Three times a day (TID) | ORAL | 0 refills | Status: DC | PRN

## 2023-09-02 NOTE — Assessment & Plan Note (Signed)
 Chronic and stable.  Management will be nonoperative.  Will start physical therapy.

## 2023-09-02 NOTE — Assessment & Plan Note (Signed)
 Alendronate  on hold for 2 weeks after surgery.  Can restart it June 20.

## 2023-09-02 NOTE — Progress Notes (Signed)
   Established Patient Office Visit  Subjective   Patient ID: Thomas Greer, male    DOB: [birth year]  Age: 78 y.o. MRN: [id number]  Chief Complaint  Patient presents with   Medical Management of Chronic Issues    2 week F/U for back pain management.     HPI  78 year old person here for management of low back pain.  Had a very difficult few months, he had a severe L1 compression fracture that was just managed operatively with Dr. Michale Age last week.  He spent 1 night in the hospital and then discharged home.  Doing very well at home, sleeping in the recliner, not yet able to ambulate up the stairs.  Pain has been well-controlled with the use of tramadol .  Not too sedating, no other side effects.  He is having significant constipation that has not improved with Colace.    Objective:     BP (!) 153/92   Pulse 71   Wt 157 lb (71.2 kg)   SpO2 100%   BMI 21.59 kg/m    Physical Exam  Gen: Moderate frailty, but well-appearing Back: 2 sets of incisions in his lumbar spine, no erythema, no purulence, incisions are well-approximated and healing well Neuro: Ambulating with a wide-based gait, steady with the use of a walker, full strength upper and lower extremities, normal sensation throughout Psych: Appropriate mood and affect, very pleasant, not depressed or anxious appearing    Assessment & Plan:   Problem List Items Addressed This Visit       High   Closed compression fracture of body of L1 vertebra (HCC) - Primary   Recovering well after surgery about 8 days ago.  Surgical incisions are healing well, no signs of surgical site infection.  Doing well with a walker to assist with ambulation.  I think it is time for him to do outpatient physical therapy.  This is already been ordered, he is going to call [facility] to start that.  We talked about medication management for the low back pain.  We talked about reasonable expectations, very likely still can have low back pain from the sacral  fractures for weeks to months to come.  We talked about safe use of tramadol , I refilled for another few days to only use as needed.  When he finishes that medication I think there will be benefit to restarting Cymbalta .      Relevant Medications   traMADol  (ULTRAM ) 50 MG tablet   Sacral fracture (HCC)   Chronic and stable.  Management will be nonoperative.  Will start physical therapy.        Medium    Osteoporosis (Chronic)   Alendronate  on hold for 2 weeks after surgery.  Can restart it June 20.       Return in about 2 weeks (around 09/16/2023).    Ether Hercules, MD

## 2023-09-02 NOTE — Assessment & Plan Note (Signed)
 Recovering well after surgery about 8 days ago.  Surgical incisions are healing well, no signs of surgical site infection.  Doing well with a walker to assist with ambulation.  I think it is time for him to do outpatient physical therapy.  This is already been ordered, he is going to call [facility] to start that.  We talked about medication management for the low back pain.  We talked about reasonable expectations, very likely still can have low back pain from the sacral fractures for weeks to months to come.  We talked about safe use of tramadol , I refilled for another few days to only use as needed.  When he finishes that medication I think there will be benefit to restarting Cymbalta .

## 2023-09-12 DIAGNOSIS — S32010A Wedge compression fracture of first lumbar vertebra, initial encounter for closed fracture: Secondary | ICD-10-CM | POA: Diagnosis not present

## 2023-09-19 ENCOUNTER — Encounter: Payer: Self-pay | Admitting: Student in an Organized Health Care Education/Training Program

## 2023-09-19 ENCOUNTER — Ambulatory Visit (INDEPENDENT_AMBULATORY_CARE_PROVIDER_SITE_OTHER): Admitting: Student in an Organized Health Care Education/Training Program

## 2023-09-19 VITALS — BP 132/85 | HR 79 | Wt 150.0 lb

## 2023-09-19 DIAGNOSIS — N401 Enlarged prostate with lower urinary tract symptoms: Secondary | ICD-10-CM | POA: Diagnosis not present

## 2023-09-19 DIAGNOSIS — S32010D Wedge compression fracture of first lumbar vertebra, subsequent encounter for fracture with routine healing: Secondary | ICD-10-CM | POA: Diagnosis not present

## 2023-09-19 DIAGNOSIS — M8000XD Age-related osteoporosis with current pathological fracture, unspecified site, subsequent encounter for fracture with routine healing: Secondary | ICD-10-CM

## 2023-09-19 DIAGNOSIS — R351 Nocturia: Secondary | ICD-10-CM

## 2023-09-19 DIAGNOSIS — S32010A Wedge compression fracture of first lumbar vertebra, initial encounter for closed fracture: Secondary | ICD-10-CM

## 2023-09-19 DIAGNOSIS — M8000XA Age-related osteoporosis with current pathological fracture, unspecified site, initial encounter for fracture: Secondary | ICD-10-CM

## 2023-09-19 DIAGNOSIS — S3210XD Unspecified fracture of sacrum, subsequent encounter for fracture with routine healing: Secondary | ICD-10-CM | POA: Diagnosis not present

## 2023-09-19 MED ORDER — TAMSULOSIN HCL 0.4 MG PO CAPS: 0.4000 mg | ORAL_CAPSULE | Freq: Every day | ORAL | 3 refills | Status: AC

## 2023-09-19 NOTE — Assessment & Plan Note (Signed)
 Improving nicely.  Fall was about 7 weeks ago and surgery was about 3 weeks ago.  Doing well postoperatively.  Starting physical therapy later this week.  Off tramadol  at this point and doing okay.

## 2023-09-19 NOTE — Progress Notes (Signed)
   Established Patient Office Visit  Subjective   Patient ID: Thomas Greer, male    DOB: 1945/05/12  Age: 78 y.o. MRN: [id number]  Chief Complaint  Patient presents with   Medical Management of Chronic Issues    3 week follow up for Closed compression fracture of L1 vertebra     HPI  78 year old person here for follow-up of low back pain due to a L1 compression fracture now surgically repaired and sacral fractures after a fall about 7 weeks ago.  Doing much better than he was compared to before the surgery.  Recovering very nicely over the last 2 weeks.  Stopped using tramadol  a few nights ago.  No further falls.  Mostly been around the house, walks with the assistance of a cane.  Struggles to get out of the house some but has a trip planned to the beach for a family reunion this weekend.  Also planning to start physical therapy on Thursday.  Mood is in good spirits.  Denies any anxiety or depression currently.  Great support from his family.    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 Neuro: Alert, conversational, very slow to get up and go, hunched forward posture, wide-based gait using a cane    Assessment & Plan:   Problem List Items Addressed This Visit       High   Closed compression fracture of body of L1 vertebra (HCC)   Improving nicely.  Fall was about 7 weeks ago and surgery was about 3 weeks ago.  Doing well postoperatively.  Starting physical therapy later this week.  Off  tramadol  at this point and doing okay.      Sacral fracture (HCC) - Primary   About 7 weeks after the fall now.  Still having some pain in his right hip radiating into the right groin.  Seems to be improving, starting physical therapy later this week.        Medium    Osteoporosis (Chronic)   Chronic issue.  Restarted alendronate  after having surgery on the L1 compression fracture.  Tolerating the medication well with no reflux side effects.  Will plan to continue this medication once weekly for 5 years.        Low   BPH (benign prostatic hyperplasia) (Chronic)   Chronic and stable.  Tolerating Flomax  well, no adverse side effects, reports good benefit.  Refilled this medicine for a 90-day supply.      Relevant Medications   tamsulosin  (FLOMAX ) 0.4 MG CAPS capsule    Return in about 4 weeks (around 10/17/2023) for back pain management.    Cleatus Debby Specking, MD

## 2023-09-19 NOTE — Assessment & Plan Note (Signed)
 Chronic issue.  Restarted alendronate  after having surgery on the L1 compression fracture.  Tolerating the medication well with no reflux side effects.  Will plan to continue this medication once weekly for 5 years.

## 2023-09-19 NOTE — Assessment & Plan Note (Signed)
 Chronic and stable.  Tolerating Flomax  well, no adverse side effects, reports good benefit.  Refilled this medicine for a 90-day supply.

## 2023-09-19 NOTE — Assessment & Plan Note (Signed)
 About 7 weeks after the fall now.  Still having some pain in his right hip radiating into the right groin.  Seems to be improving, starting physical therapy later this week.

## 2023-09-29 ENCOUNTER — Other Ambulatory Visit: Payer: Self-pay | Admitting: Student in an Organized Health Care Education/Training Program

## 2023-09-29 DIAGNOSIS — S32010A Wedge compression fracture of first lumbar vertebra, initial encounter for closed fracture: Secondary | ICD-10-CM

## 2023-10-05 DIAGNOSIS — M545 Low back pain, unspecified: Secondary | ICD-10-CM | POA: Diagnosis not present

## 2023-10-05 DIAGNOSIS — R29898 Other symptoms and signs involving the musculoskeletal system: Secondary | ICD-10-CM | POA: Diagnosis not present

## 2023-10-13 DIAGNOSIS — M545 Low back pain, unspecified: Secondary | ICD-10-CM | POA: Diagnosis not present

## 2023-10-13 DIAGNOSIS — R29898 Other symptoms and signs involving the musculoskeletal system: Secondary | ICD-10-CM | POA: Diagnosis not present

## 2023-10-17 ENCOUNTER — Ambulatory Visit: Admitting: Student in an Organized Health Care Education/Training Program

## 2023-10-17 ENCOUNTER — Encounter: Payer: Self-pay | Admitting: Student in an Organized Health Care Education/Training Program

## 2023-10-17 VITALS — BP 131/75 | HR 69 | Wt 151.0 lb

## 2023-10-17 DIAGNOSIS — S32010A Wedge compression fracture of first lumbar vertebra, initial encounter for closed fracture: Secondary | ICD-10-CM

## 2023-10-17 DIAGNOSIS — L304 Erythema intertrigo: Secondary | ICD-10-CM | POA: Diagnosis not present

## 2023-10-17 DIAGNOSIS — N401 Enlarged prostate with lower urinary tract symptoms: Secondary | ICD-10-CM | POA: Diagnosis not present

## 2023-10-17 DIAGNOSIS — M8000XA Age-related osteoporosis with current pathological fracture, unspecified site, initial encounter for fracture: Secondary | ICD-10-CM

## 2023-10-17 DIAGNOSIS — L259 Unspecified contact dermatitis, unspecified cause: Secondary | ICD-10-CM | POA: Insufficient documentation

## 2023-10-17 DIAGNOSIS — L821 Other seborrheic keratosis: Secondary | ICD-10-CM

## 2023-10-17 DIAGNOSIS — R351 Nocturia: Secondary | ICD-10-CM | POA: Diagnosis not present

## 2023-10-17 MED ORDER — NYSTATIN 100000 UNIT/GM EX CREA: 1.0000 | TOPICAL_CREAM | Freq: Two times a day (BID) | CUTANEOUS | 0 refills | Status: DC

## 2023-10-17 NOTE — Patient Instructions (Signed)
  VISIT SUMMARY: Today, you had a follow-up appointment to review your recovery after back surgery and ongoing physical therapy. We discussed your progress, current symptoms, and medications. You reported improved pain and mobility, some morning stiffness, and a rash that started after your shingles shot. We also reviewed your diet, weight changes, and use of a walking stick.  YOUR PLAN: -CLOSED COMPRESSION FRACTURE OF BODY OF L1 VERTEBRA: This is a type of spinal fracture that has been treated with surgery. You are two months post-surgery and have reported improved pain and mobility. Continue with your physical therapy and monitor your pain. You may consider returning to work by mid-August or early September.  -OSTEOPOROSIS: This condition causes bones to become weak and brittle. You are on weekly medication and have experienced some weight loss. Continue with your current diet and medication, and monitor your weight to make any necessary dietary adjustments. Contact the pharmacy for medication refills.  -INTERTRIGO: This is a fungal infection that occurs in skin folds due to moisture and warmth. You have been prescribed an antifungal cream to treat this condition.  -BPH (BENIGN PROSTATIC HYPERPLASIA): This is an enlarged prostate gland that can cause urinary symptoms. You are currently taking Tamsulosin , and you have an adequate supply with refills available. Contact the pharmacy for refills as needed.  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 There is no need for treatment unless it becomes bothersome. Monitor for any changes.  INSTRUCTIONS: Continue with your physical therapy sessions and home exercises. Monitor your pain and adjust activities as needed. Consider returning to work by mid-August or early September. Maintain your current diet and monitor your weight. Contact the pharmacy for medication refills for your osteoporosis and BPH medications. Use the prescribed  antifungal cream for your intertrigo. Monitor your seborrheic keratosis for any changes.

## 2023-10-17 NOTE — Assessment & Plan Note (Signed)
 Two months post-surgery, he reports improved pain and mobility and is actively engaged in physical therapy. He is considering a return to work. Continue physical therapy and monitor pain, adjusting activities as needed. Consider returning to work by mid-August or early September.

## 2023-10-17 NOTE — Assessment & Plan Note (Signed)
 He is on weekly medication and has experienced weight loss. Contact the pharmacy for medication refills. Maintain the current diet and weight, and monitor weight to adjust the diet if necessary.

## 2023-10-17 NOTE — Assessment & Plan Note (Signed)
 He has a benign growth with no risk of malignancy. Monitor for changes and leave it alone unless it becomes bothersome.

## 2023-10-17 NOTE — Assessment & Plan Note (Signed)
 He is on Tamsulosin  with an adequate supply and refills available. Contact the pharmacy for Tamsulosin  refills.

## 2023-10-17 NOTE — Assessment & Plan Note (Signed)
 He has a fungal infection in a skin fold due to moisture and warmth. Prescribe antifungal cream.

## 2023-10-17 NOTE — Progress Notes (Signed)
 Established Patient Office Visit  Subjective   Patient ID: Thomas Greer, male    DOB: [birth year]  Age: 78 y.o. MRN: [id number]  Chief Complaint  Patient presents with   Medical Management of Chronic Issues    4 week follow up for back pain. Would like a rash looked at.     HPI  Discussed the use of AI scribe software for clinical note transcription with the patient, who gave verbal consent to proceed.  History of Present Illness Thomas Greer is a 78 year old male who presents for follow-up after back surgery and ongoing physical therapy.  He underwent back surgery for compression fractures on Aug 15, 2023, and has been attending physical therapy sessions regularly, with two sessions per week planned for the upcoming weeks. He has been given exercises to perform at home to strengthen his legs. He experiences stiffness in the morning, which improves by lunchtime.  He reports persistent numbness in one finger, which is alleviated by pressing on the joint. He also mentions a rash that started after his second shingles shot, which comes and goes, causing itching but no pain.  He is currently taking medication for his prostate and a weekly medication for his bones. He experiences some fatigue and describes his pain level as a two out of ten. He has adjusted his diet to include more fruits and pastas, avoiding meat as it makes him feel unwell. His weight has decreased from 158 pounds in May to 151 pounds currently.  He uses a walking stick occasionally, especially when driving long distances, and has been able to drive himself to appointments.      Objective:     BP 131/75   Pulse 69   Wt 151 lb (68.5 kg)   SpO2 100%   BMI 20.77 kg/m    Physical Exam  Gen: Well-appearing man Skin: Multiple noninflamed seborrheic keratoses on his chest and lower neck.  No pigmented atypical lesions. Heart: Regular, no murmur Lungs: Unlabored, clear throughout Psych: Affable, very pleasant  affect, not depressed or anxious appearing Neuro: Alert, conversational, slow get up and go, uses a walking stick for assist, hunched over gait, mild weakness in both lower extremities right a little worse than the left    Assessment & Plan:    Problem List Items Addressed This Visit       High   Closed compression fracture of body of L1 vertebra (HCC) (Chronic)   Two months post-surgery, he reports improved pain and mobility and is actively engaged in physical therapy. He is considering a return to work. Continue physical therapy and monitor pain, adjusting activities as needed. Consider returning to work by mid-August or early September.        Medium    Osteoporosis - Primary (Chronic)   He is on weekly medication and has experienced weight loss. Contact the pharmacy for medication refills. Maintain the current diet and weight, and monitor weight to adjust the diet if necessary.        Low   BPH (benign prostatic hyperplasia) (Chronic)   He is on Tamsulosin  with an adequate supply and refills available. Contact the pharmacy for Tamsulosin  refills.        Unprioritized   Seborrheic keratosis   He has a benign growth with no risk of malignancy. Monitor for changes and leave it alone unless it becomes bothersome.      Intertrigo   He has a fungal infection in a skin fold due to moisture  and warmth. Prescribe antifungal cream.      Relevant Medications   nystatin  cream (MYCOSTATIN )    Return in about 3 months (around 01/17/2024).    Cleatus Debby Specking, MD

## 2023-10-19 DIAGNOSIS — R29898 Other symptoms and signs involving the musculoskeletal system: Secondary | ICD-10-CM | POA: Diagnosis not present

## 2023-10-19 DIAGNOSIS — M545 Low back pain, unspecified: Secondary | ICD-10-CM | POA: Diagnosis not present

## 2023-10-21 DIAGNOSIS — R29898 Other symptoms and signs involving the musculoskeletal system: Secondary | ICD-10-CM | POA: Diagnosis not present

## 2023-10-21 DIAGNOSIS — M545 Low back pain, unspecified: Secondary | ICD-10-CM | POA: Diagnosis not present

## 2023-10-26 DIAGNOSIS — R29898 Other symptoms and signs involving the musculoskeletal system: Secondary | ICD-10-CM | POA: Diagnosis not present

## 2023-10-26 DIAGNOSIS — M545 Low back pain, unspecified: Secondary | ICD-10-CM | POA: Diagnosis not present

## 2023-11-02 DIAGNOSIS — M545 Low back pain, unspecified: Secondary | ICD-10-CM | POA: Diagnosis not present

## 2023-11-02 DIAGNOSIS — R29898 Other symptoms and signs involving the musculoskeletal system: Secondary | ICD-10-CM | POA: Diagnosis not present

## 2023-11-03 ENCOUNTER — Other Ambulatory Visit: Payer: Self-pay | Admitting: Student in an Organized Health Care Education/Training Program

## 2023-11-03 DIAGNOSIS — L304 Erythema intertrigo: Secondary | ICD-10-CM

## 2023-11-03 NOTE — Telephone Encounter (Unsigned)
 Copied from CRM [id number]. Topic: Clinical - Medication Refill >> Nov 03, 2023  4:43 PM Jayma L wrote: Medication: nystatin  cream (MYCOSTATIN )  Has the patient contacted their pharmacy? Yes (Agent: If no, request that the patient contact the pharmacy for the refill. If patient does not wish to contact the pharmacy document the reason why and proceed with request.) (Agent: If yes, when and what did the pharmacy advise?)  This is the patient's preferred pharmacy:  [id number] - [place], [place] - [address] [place] [address] Phone: [phone] Fax: [phone]  Is this the correct pharmacy for this prescription? Yes If no, delete pharmacy and type the correct one.   Has the prescription been filled recently? No  Is the patient out of the medication? Yes  Has the patient been seen for an appointment in the last year OR does the patient have an upcoming appointment? Yes  Can we respond through MyChart? No  Agent: Please be advised that Rx refills may take up to 3 business days. We ask that you follow-up with your pharmacy.

## 2023-11-04 DIAGNOSIS — M545 Low back pain, unspecified: Secondary | ICD-10-CM | POA: Diagnosis not present

## 2023-11-04 DIAGNOSIS — R29898 Other symptoms and signs involving the musculoskeletal system: Secondary | ICD-10-CM | POA: Diagnosis not present

## 2023-11-04 MED ORDER — NYSTATIN 100000 UNIT/GM EX CREA
1.0000 | TOPICAL_CREAM | Freq: Two times a day (BID) | CUTANEOUS | 0 refills | Status: DC
Start: 2023-11-04 — End: 2023-11-24

## 2023-11-04 NOTE — Telephone Encounter (Signed)
 Requested Prescriptions   Pending Prescriptions Disp Refills   nystatin  cream (MYCOSTATIN ) 30 g 0    Sig: Apply 1 Application topically 2 (two) times daily.     Date of patient request: 11/04/23 Last office visit: 10/17/2023 Upcoming visit: 01/20/2024 Date of last refill: 10/17/23 Last refill amount: 15 day supply   Patient is requesting refill for cream. Okay to fill?

## 2023-11-09 DIAGNOSIS — M545 Low back pain, unspecified: Secondary | ICD-10-CM | POA: Diagnosis not present

## 2023-11-09 DIAGNOSIS — R29898 Other symptoms and signs involving the musculoskeletal system: Secondary | ICD-10-CM | POA: Diagnosis not present

## 2023-11-11 DIAGNOSIS — M545 Low back pain, unspecified: Secondary | ICD-10-CM | POA: Diagnosis not present

## 2023-11-11 DIAGNOSIS — R29898 Other symptoms and signs involving the musculoskeletal system: Secondary | ICD-10-CM | POA: Diagnosis not present

## 2023-11-15 DIAGNOSIS — R29898 Other symptoms and signs involving the musculoskeletal system: Secondary | ICD-10-CM | POA: Diagnosis not present

## 2023-11-15 DIAGNOSIS — M545 Low back pain, unspecified: Secondary | ICD-10-CM | POA: Diagnosis not present

## 2023-11-17 DIAGNOSIS — H2513 Age-related nuclear cataract, bilateral: Secondary | ICD-10-CM | POA: Diagnosis not present

## 2023-11-21 DIAGNOSIS — M545 Low back pain, unspecified: Secondary | ICD-10-CM | POA: Diagnosis not present

## 2023-11-21 DIAGNOSIS — R29898 Other symptoms and signs involving the musculoskeletal system: Secondary | ICD-10-CM | POA: Diagnosis not present

## 2023-11-22 ENCOUNTER — Other Ambulatory Visit (HOSPITAL_COMMUNITY): Payer: Self-pay

## 2023-11-24 ENCOUNTER — Ambulatory Visit (INDEPENDENT_AMBULATORY_CARE_PROVIDER_SITE_OTHER): Admitting: Student in an Organized Health Care Education/Training Program

## 2023-11-24 ENCOUNTER — Encounter: Payer: Self-pay | Admitting: Student in an Organized Health Care Education/Training Program

## 2023-11-24 VITALS — BP 147/98 | HR 65 | Wt 159.0 lb

## 2023-11-24 DIAGNOSIS — L259 Unspecified contact dermatitis, unspecified cause: Secondary | ICD-10-CM

## 2023-11-24 MED ORDER — TRIAMCINOLONE ACETONIDE 0.1 % EX OINT: 1.0000 | TOPICAL_OINTMENT | Freq: Two times a day (BID) | CUTANEOUS | 1 refills | Status: DC

## 2023-11-24 NOTE — Assessment & Plan Note (Signed)
 Persistent erythematous rash with fine scale suggests inflammatory dermatitis, atypical for psoriasis and without bacterial infection. Contact dermatitis is considered due to environmental factors.  I think the nystatin  cream was successful for treating the intertrigo, he started using the nystatin  more widespread on his chest and I wonder if it may have contributed to a contact dermatitis.  Prescribed triamcinolone  ointment twice daily and discontinue nystatin  cream. Review and avoid potential irritants such as soaps and detergents. Use sensitive skin products.

## 2023-11-24 NOTE — Progress Notes (Signed)
   Acute Office Visit  Subjective:     Patient ID: Thomas Greer, male    DOB: 06/27/1945, 78 y.o.   MRN: [id number]  Chief Complaint  Patient presents with   Rash    Rash that itches and burns and using cream but does not seem to helping.     HPI  Discussed the use of AI scribe software for clinical note transcription with the patient, who gave verbal consent to proceed.  History of Present Illness Thomas Greer is a 78 year old male with psoriasis who presents with a persistent rash.  He has had a persistent rash for a couple of months, first noted in July. The rash initially appeared in the skin folds and the patient reports it is now present on his chest. It is particularly itchy around 3 AM. No pain is associated with the rash. He has been using nystatin  cream twice daily without improvement.  He denies any changes in soaps or detergents, continuing to use Dial soap and Tide detergent, which he has used for a long time. The rash is not present on his back or arms and does not extend to his legs. There have been no recent changes in his medications.  His current medications include a weekly pill for osteoporosis, a medication for benign prostatic hyperplasia, and occasional Tylenol  for back pain. He has been on these medications since June and also uses Flonase. He has a history of psoriasis, which typically affects his elbows and legs, but he describes it as mild.  He has gained weight recently, going from 149 pounds to a higher weight, and feels 'pretty decent'. He is planning to return to work soon and has been engaging in physical therapy, which he finds beneficial.      Objective:    BP (!) 147/98   Pulse 65   Wt 159 lb (72.1 kg)   BMI 21.87 kg/m   Physical Exam  Gen: Well-appearing man, no distress Skin: On his anterior chest he has a diffuse erythematous maculopapular rash with overlying fine scale, yellow thick plaque buildup.  The intertriginous erythema is now  resolved.  There is no confluent erythema.  He has areas of erosions from scratching.        Assessment & Plan:    Problem List Items Addressed This Visit       Unprioritized   Contact dermatitis - Primary   Persistent erythematous rash with fine scale suggests inflammatory dermatitis, atypical for psoriasis and without bacterial infection. Contact dermatitis is considered due to environmental factors.  I think the nystatin  cream was successful for treating the intertrigo, he started using the nystatin  more widespread on his chest and I wonder if it may have contributed to a contact dermatitis.  Prescribed triamcinolone  ointment twice daily and discontinue nystatin  cream. Review and avoid potential irritants such as soaps and detergents. Use sensitive skin products.      Relevant Medications   triamcinolone  ointment (KENALOG ) 0.1 %    Meds ordered this encounter  Medications   triamcinolone  ointment (KENALOG ) 0.1 %    Sig: Apply 1 Application topically 2 (two) times daily.    Dispense:  30 g    Refill:  1    No follow-ups on file.  Cleatus Debby Specking, MD

## 2023-11-24 NOTE — Patient Instructions (Signed)
  VISIT SUMMARY: During your visit, we discussed the persistent rash you have been experiencing and reviewed your current medications and recent weight gain. We also talked about your ongoing physical therapy for your back.  YOUR PLAN: -DERMATITIS OF CHEST: Dermatitis is an inflammation of the skin. You have a persistent rash on your chest that is itchy, especially at night. We believe it may be due to contact with environmental factors. We are prescribing triamcinolone  ointment to be applied twice daily and advising you to stop using nystatin  cream. Please review and avoid potential irritants such as soaps and detergents, and consider using products designed for sensitive skin.  -CLOSED COMPRESSION FRACTURE OF BODY OF L1 VERTEBRA: A closed compression fracture is a type of broken bone in the spine. Your physical therapy has been helping, and you do not need a brace. Continue with your physical therapy exercises as advised.  INSTRUCTIONS: Please follow up as needed if your symptoms do not improve or if you have any concerns. Continue with your physical therapy exercises and monitor your skin for any changes. If the rash does not improve with the new ointment, please contact us  for further evaluation.

## 2023-11-29 DIAGNOSIS — R29898 Other symptoms and signs involving the musculoskeletal system: Secondary | ICD-10-CM | POA: Diagnosis not present

## 2023-11-29 DIAGNOSIS — M545 Low back pain, unspecified: Secondary | ICD-10-CM | POA: Diagnosis not present

## 2023-12-05 DIAGNOSIS — H25811 Combined forms of age-related cataract, right eye: Secondary | ICD-10-CM | POA: Diagnosis not present

## 2023-12-05 DIAGNOSIS — H25812 Combined forms of age-related cataract, left eye: Secondary | ICD-10-CM | POA: Diagnosis not present

## 2023-12-06 DIAGNOSIS — R29898 Other symptoms and signs involving the musculoskeletal system: Secondary | ICD-10-CM | POA: Diagnosis not present

## 2023-12-06 DIAGNOSIS — M545 Low back pain, unspecified: Secondary | ICD-10-CM | POA: Diagnosis not present

## 2023-12-14 DIAGNOSIS — H25811 Combined forms of age-related cataract, right eye: Secondary | ICD-10-CM | POA: Diagnosis not present

## 2023-12-14 DIAGNOSIS — H2511 Age-related nuclear cataract, right eye: Secondary | ICD-10-CM | POA: Diagnosis not present

## 2023-12-28 DIAGNOSIS — H25812 Combined forms of age-related cataract, left eye: Secondary | ICD-10-CM | POA: Diagnosis not present

## 2024-01-20 ENCOUNTER — Encounter: Payer: Self-pay | Admitting: Student in an Organized Health Care Education/Training Program

## 2024-01-20 ENCOUNTER — Ambulatory Visit (INDEPENDENT_AMBULATORY_CARE_PROVIDER_SITE_OTHER): Admitting: Student in an Organized Health Care Education/Training Program

## 2024-01-20 ENCOUNTER — Ambulatory Visit: Admitting: Student in an Organized Health Care Education/Training Program

## 2024-01-20 ENCOUNTER — Other Ambulatory Visit (HOSPITAL_COMMUNITY)
Admission: RE | Admit: 2024-01-20 | Discharge: 2024-01-20 | Disposition: A | Source: Ambulatory Visit | Attending: Student in an Organized Health Care Education/Training Program | Admitting: Student in an Organized Health Care Education/Training Program

## 2024-01-20 VITALS — BP 154/123 | HR 76 | Wt 157.0 lb

## 2024-01-20 DIAGNOSIS — L821 Other seborrheic keratosis: Secondary | ICD-10-CM

## 2024-01-20 DIAGNOSIS — L989 Disorder of the skin and subcutaneous tissue, unspecified: Secondary | ICD-10-CM | POA: Diagnosis present

## 2024-01-20 DIAGNOSIS — L820 Inflamed seborrheic keratosis: Secondary | ICD-10-CM | POA: Diagnosis not present

## 2024-01-20 NOTE — Progress Notes (Signed)
   Acute Office Visit  Patient ID: Thomas Greer, male    DOB: January 15, 1946, 78 y.o.   MRN: [id number]  PCP: Jerrell Cleatus Ned, MD  Chief Complaint  Patient presents with   Medical Management of Chronic Issues    3 month follow uo    Subjective:     HPI  Discussed the use of AI scribe software for clinical note transcription with the patient, who gave verbal consent to proceed.  History of Present Illness Thomas Greer is a 78 year old male who presents with a new growth on the left side of his face.  Approximately two to three weeks ago, he noticed a new growth on the left side of his face after shaving his beard. The growth is firm and has bled once. He is concerned about its nature and is seeking removal.  The growth has made it difficult for him to maintain his beard, which he grew for his granddaughter and won a prize for being the oldest person with a beard at school.  He is currently taking medication for benign prostatic hyperplasia and osteoporosis, which he takes regularly. He has stopped all other medications. He has a known allergy to penicillin but no known allergies to lidocaine .  No other symptoms related to the growth.      Objective:    BP (!) 154/123   Pulse 76   Wt 157 lb (71.2 kg)   SpO2 100%   BMI 21.59 kg/m   Physical Exam  Gen: Well-appearing man Skin: On the left side of his face at the angle of the jaw there is a 6 mm rough papule with some signs of recent bleeding.  It is not fleshy, but also not keratinized.  No telangiectasias.  No pigmentation.  Not verrucous.       Assessment & Plan:   Problem List Items Addressed This Visit       Unprioritized   Skin lesion of face - Primary   A firm, non-pigmented lesion on the left face is present. Differential diagnosis includes seborrheic keratosis, basal cell carcinoma, and actinic keratosis. Melanoma is not suspected. He opted for excision. Excised the lesion with a blade after administering  lidocaine  anesthesia. Sent tissue for pathology to rule out basal cell carcinoma. Advised wound care with a bandage and change if bleeding occurs. Instruct on wound healing without sutures. Inform him of lab results in 1-2 weeks.   Shave Biopsy Procedure Note  Pre-operative Diagnosis: face lesion  Locations: left side of his face, angle of the jaw  Anesthesia: Lidocaine  1% without epinephrine   Procedure Details   Patient informed of the risks (including bleeding and infection) and benefits of the procedure and verbal informed consent obtained.  The lesion and surrounding area was prepped with an alcohol swab. A scalpel or Dermablade was used to shave an area of skin.  Hemostasis achieved with pressure. Sterile dressing applied.  The specimen was sent for pathologic examination. The patient tolerated the procedure well.  Condition: Stable  Complications: none.  Plan: 1. Instructed to keep the wound dry and covered for 24-48h and clean thereafter. 2. Patient instructed to apply Vaseline daily until healed. 3. Warning signs of infection were reviewed.         Relevant Orders   Surgical pathology    Cleatus Ned Jerrell, MD [facility]

## 2024-01-20 NOTE — Assessment & Plan Note (Signed)
 A firm, non-pigmented lesion on the left face is present. Differential diagnosis includes seborrheic keratosis, basal cell carcinoma, and actinic keratosis. Melanoma is not suspected. He opted for excision. Excised the lesion with a blade after administering lidocaine  anesthesia. Sent tissue for pathology to rule out basal cell carcinoma. Advised wound care with a bandage and change if bleeding occurs. Instruct on wound healing without sutures. Inform him of lab results in 1-2 weeks.   Shave Biopsy Procedure Note  Pre-operative Diagnosis: face lesion  Locations: left side of his face, angle of the jaw  Anesthesia: Lidocaine  1% without epinephrine   Procedure Details   Patient informed of the risks (including bleeding and infection) and benefits of the procedure and verbal informed consent obtained.  The lesion and surrounding area was prepped with an alcohol swab. A scalpel or Dermablade was used to shave an area of skin.  Hemostasis achieved with pressure. Sterile dressing applied.  The specimen was sent for pathologic examination. The patient tolerated the procedure well.  Condition: Stable  Complications: none.  Plan: 1. Instructed to keep the wound dry and covered for 24-48h and clean thereafter. 2. Patient instructed to apply Vaseline daily until healed. 3. Warning signs of infection were reviewed.

## 2024-01-20 NOTE — Patient Instructions (Signed)
  VISIT SUMMARY: During your visit, we discussed the new growth on the left side of your face, your ongoing treatment for benign prostatic hyperplasia, and your osteoporosis management. We also reviewed your general health maintenance activities.  YOUR PLAN: -SKIN LESION: You have a firm, non-pigmented growth on the left side of your face. This could be a benign condition like seborrheic keratosis or actinic keratosis, but we need to rule out basal cell carcinoma. We will remove the lesion using a blade after numbing the area with lidocaine . The tissue will be sent for pathology to check for any signs of cancer. Please take care of the wound by keeping it bandaged and changing the bandage if it bleeds. The wound will heal without stitches. We will inform you of the lab results in 1-2 weeks.  -BENIGN PROSTATIC HYPERPLASIA: This condition involves the enlargement of the prostate gland, which can cause urinary symptoms. You should continue taking your current medication as prescribed.  -OSTEOPOROSIS: Osteoporosis is a condition where bones become weak and brittle. You are on a weekly medication regimen to help strengthen your bones. Continue taking your medication as directed.  -GENERAL HEALTH MAINTENANCE: You are doing well with your home physical therapy exercises and plan to continue them for a year. Keep up the good work to maintain your overall health.  INSTRUCTIONS: We will inform you of the lab results from the skin lesion biopsy in 1-2 weeks. Continue taking your medications for benign prostatic hyperplasia and osteoporosis as prescribed. Maintain your home physical therapy exercises.

## 2024-01-25 LAB — SURGICAL PATHOLOGY

## 2024-01-26 ENCOUNTER — Ambulatory Visit: Payer: Self-pay | Admitting: Student in an Organized Health Care Education/Training Program

## 2024-03-16 ENCOUNTER — Encounter: Payer: Self-pay | Admitting: Student in an Organized Health Care Education/Training Program

## 2024-03-16 ENCOUNTER — Ambulatory Visit: Payer: Medicare HMO | Admitting: Student in an Organized Health Care Education/Training Program

## 2024-03-16 VITALS — BP 134/62 | HR 70 | Wt 152.0 lb

## 2024-03-16 DIAGNOSIS — L409 Psoriasis, unspecified: Secondary | ICD-10-CM

## 2024-03-16 DIAGNOSIS — N529 Male erectile dysfunction, unspecified: Secondary | ICD-10-CM | POA: Insufficient documentation

## 2024-03-16 DIAGNOSIS — H6123 Impacted cerumen, bilateral: Secondary | ICD-10-CM

## 2024-03-16 DIAGNOSIS — F39 Unspecified mood [affective] disorder: Secondary | ICD-10-CM | POA: Insufficient documentation

## 2024-03-16 DIAGNOSIS — H6120 Impacted cerumen, unspecified ear: Secondary | ICD-10-CM | POA: Insufficient documentation

## 2024-03-16 MED ORDER — SILDENAFIL CITRATE 50 MG PO TABS: 50.0000 mg | ORAL_TABLET | Freq: Every day | ORAL | 0 refills | Status: AC | PRN

## 2024-03-16 NOTE — Assessment & Plan Note (Signed)
 Procedure Note: Manual Removal of Impacted Cerumen Using a Curette   Indication:  Cerumen impaction bilareally causing symptoms (e.g., hearing loss, pain, tinnitus) or preventing assessment of the ear canal, and tympanic membrane.  Procedure:  Explained the procedure to the patient and informed consent was obtained  Review patient history for contraindications (e.g., nonintact tympanic membrane, history of ear surgery, anatomical abnormalities).  After a position of the patient's head upright, I visualized the ear canal and cerumen using an otoscope.  I gently inserted the curette into the ear canal avoiding contact with the canal walls, and carefully scooped the cerumen, removing it in small pieces.  I reassessed the ear canal and tympanic membrane, there was no residual cerumen nor signs of trauma.  Follow-Up:  I instructed the patient to report any persistent symptoms such as pain, discharge, or hearing loss.  Schedule a follow-up appointment if necessary.
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
## 2024-03-16 NOTE — Assessment & Plan Note (Signed)
 Lesions are present on his trunk and exacerbated by stress. Current treatment with steroid cream is effective. Continue using steroid cream on psoriasis lesions as needed.

## 2024-03-16 NOTE — Assessment & Plan Note (Signed)
 Significant relationship stress and anxiety are impacting erectile function. He is interested in therapy with a male therapist and a faith-based approach. Referred to a psychology therapy group for a male therapist, preferably faith-based. Encouraged exploration of faith-based counseling services through his church.  We have used medications in the past including duloxetine , can explore this in future if needed.

## 2024-03-16 NOTE — Patient Instructions (Signed)
" °  VISIT SUMMARY: Today, you were seen for concerns about decreased sexual interest and relationship strain. We discussed several health issues, including erectile dysfunction, mood disorder with relationship stress, chronic back pain, psoriasis, ear wax buildup, and eye itching. A treatment plan was created to address each of these concerns.  YOUR PLAN: -VASCULOGENIC ERECTILE DYSFUNCTION: Erectile dysfunction is likely due to decreased blood flow related to aging, anxiety, and relationship stress, with possible nerve damage from your back injury. You were prescribed Viagra , starting with half a tablet as needed, up to one full tablet if necessary. Take Viagra  1-2 hours before intercourse, with or without food. Be aware of potential side effects, including lightheadedness.  -MOOD DISORDER WITH RELATIONSHIP STRESS: Your relationship stress and anxiety are affecting your erectile function. You expressed interest in therapy with a male therapist and a faith-based approach. You were referred to a psychology therapy group for a male therapist, preferably faith-based. Consider exploring faith-based counseling services.  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 You are doing daily physical therapy at home. Continue with your daily physical therapy exercises. You have a follow-up appointment with an orthopedic specialist on January 12th for further evaluation and possible x-rays.  -PSORIASIS: Psoriasis is a skin condition that causes red, itchy, and scaly patches, often triggered by stress. Continue using your steroid cream on the psoriasis lesions as needed.  -BILATERAL IMPACTED CERUMEN: You had significant wax buildup in both ears, likely worsened by using Q-tips. Ear irrigation was performed to remove the wax. Avoid using Q-tips for ear cleaning in the future.  -ALLERGIC CONJUNCTIVITIS: Allergic conjunctivitis is eye itching caused by  allergies or dryness. Use over-the-counter Pataday eye drops to relieve the itching.  INSTRUCTIONS: Follow up with your orthopedic specialist on January 12th for further evaluation of your back pain and possible x-rays. Continue with your daily physical therapy exercises at home. Consider exploring faith-based counseling services for relationship stress and anxiety. Avoid using Q-tips for ear cleaning. Use over-the-counter Pataday eye drops for eye itching relief.   "

## 2024-03-16 NOTE — Assessment & Plan Note (Signed)
 Erectile dysfunction is likely due to age-related decreased blood flow, anxiety, and relationship stress, with possible nerve damage from a recent back fracture and surgery. Prescribed Viagra, starting with half a tablet as needed, up to one full tablet if necessary. Advised taking Viagra 1-2 hours before intercourse. Discussed potential side effects, including lightheadedness.

## 2024-06-14 ENCOUNTER — Ambulatory Visit: Admitting: Student in an Organized Health Care Education/Training Program
